# Patient Record
Sex: Female | Born: 1980 | Race: Black or African American | Hispanic: No | Marital: Single | Smoking: Former smoker
Health system: Southern US, Community
[De-identification: ages and names within clinical notes are randomized; demographics above are authoritative.]

## PROBLEM LIST (undated history)

## (undated) DIAGNOSIS — J45909 Unspecified asthma, uncomplicated: Secondary | ICD-10-CM

## (undated) DIAGNOSIS — G4733 Obstructive sleep apnea (adult) (pediatric): Secondary | ICD-10-CM

## (undated) DIAGNOSIS — E669 Obesity, unspecified: Secondary | ICD-10-CM

## (undated) DIAGNOSIS — I272 Pulmonary hypertension, unspecified: Secondary | ICD-10-CM

## (undated) DIAGNOSIS — R030 Elevated blood-pressure reading, without diagnosis of hypertension: Secondary | ICD-10-CM

## (undated) DIAGNOSIS — I10 Essential (primary) hypertension: Secondary | ICD-10-CM

## (undated) DIAGNOSIS — I119 Hypertensive heart disease without heart failure: Secondary | ICD-10-CM

## (undated) HISTORY — DX: Essential (primary) hypertension: I10

## (undated) HISTORY — DX: Pulmonary hypertension, unspecified: I27.20

## (undated) HISTORY — DX: Obstructive sleep apnea (adult) (pediatric): G47.33

## (undated) HISTORY — DX: Elevated blood-pressure reading, without diagnosis of hypertension: R03.0

## (undated) HISTORY — PX: KNEE SURGERY: SHX244

## (undated) HISTORY — DX: Hypertensive heart disease without heart failure: I11.9

## (undated) HISTORY — PX: CYST EXCISION: SHX5701

---

## 1999-04-26 ENCOUNTER — Emergency Department (HOSPITAL_COMMUNITY): Admission: EM | Admit: 1999-04-26 | Discharge: 1999-04-26 | Payer: Self-pay | Admitting: Emergency Medicine

## 1999-04-26 ENCOUNTER — Encounter: Payer: Self-pay | Admitting: Emergency Medicine

## 1999-08-05 ENCOUNTER — Emergency Department (HOSPITAL_COMMUNITY): Admission: EM | Admit: 1999-08-05 | Discharge: 1999-08-06 | Payer: Self-pay

## 2004-05-09 ENCOUNTER — Emergency Department (HOSPITAL_COMMUNITY): Admission: EM | Admit: 2004-05-09 | Discharge: 2004-05-09 | Payer: Self-pay | Admitting: Emergency Medicine

## 2005-03-14 ENCOUNTER — Emergency Department (HOSPITAL_COMMUNITY): Admission: EM | Admit: 2005-03-14 | Discharge: 2005-03-14 | Payer: Self-pay | Admitting: Emergency Medicine

## 2005-09-07 ENCOUNTER — Emergency Department (HOSPITAL_COMMUNITY): Admission: EM | Admit: 2005-09-07 | Discharge: 2005-09-07 | Payer: Self-pay | Admitting: Emergency Medicine

## 2006-09-11 ENCOUNTER — Inpatient Hospital Stay (HOSPITAL_COMMUNITY): Admission: AD | Admit: 2006-09-11 | Discharge: 2006-09-14 | Payer: Self-pay | Admitting: Obstetrics & Gynecology

## 2007-07-17 ENCOUNTER — Emergency Department (HOSPITAL_COMMUNITY): Admission: EM | Admit: 2007-07-17 | Discharge: 2007-07-17 | Payer: Self-pay | Admitting: Emergency Medicine

## 2010-07-25 NOTE — Discharge Summary (Signed)
NAMEELPIDIA, Teresa Crane              ACCOUNT NO.:  [id number]   MEDICAL RECORD NO.:  [id number]          PATIENT TYPE:  INP   LOCATION:  9123                          FACILITY:  [facility]   PHYSICIAN:  Sherron Monday, MD        DATE OF BIRTH:  [birth year]   DATE OF ADMISSION:  09/11/2006  DATE OF DISCHARGE:  09/14/2006                               DISCHARGE SUMMARY   ADMISSION DIAGNOSIS:  Intrauterine pregnancy at term in early labor with  regular contractions.   DISCHARGE DIAGNOSIS:  Intrauterine pregnancy at term in early labor with  regular contractions and spontaneous vaginal delivery.   HISTORY OF PRESENT ILLNESS:  This is a 30 year old G4, P0-0-3-0 at 40+  weeks in early labor with contractions every 10 minutes and then to  every 5 minutes, regular and painful.  No loss of fluid.  No vaginal  bleeding.  Good fetal movement.  Uncomplicated prenatal care.   PAST MEDICAL HISTORY:  Asthma.  No hospitalizations.   PAST SURGICAL HISTORY:  Breast lumpectomy.  Left knee surgery and  tonsillectomy and adenoidectomy.   OBSTETRICAL/GYN HISTORY:  G1, 2 and 3 were all spontaneous abortions.  G4 is the present pregnancy.  Abnormal Pap smear with repeat normal.  No  sexually transmitted diseases.   MEDICATIONS:  None.   ALLERGIES:  No known drug allergies.   SOCIAL HISTORY:  She denies alcohol, tobacco or drug use.  She is  single.   FAMILY HISTORY:  Coronary artery disease in uncle.  Hypertension in  mother.  Maternal aunt and uncle with epilepsy.   PRENATAL LABORATORY DATA:  Hemoglobin 11.7, platelet count 194,000.  Blood type A positive, antibody screen negative.  Sickle cell within  normal limits.  Pap smear within normal limits.  Gonorrhea and Chlamydia  negative.  RPR nonreactive.  Rubella immune.  Hepatitis B surface  antigen negative.  HIV negative.  Quad screen negative.  Positive Group  B Strep.  Glucola 87.  Cystic fibrosis screen was negative.   An ultrasound performed on  March 08, 2006 at 13-6/7 weeks revealed a  normal nuchal thickness.  Ultrasound on April 05, 2006 17-6/7 weeks  revealed anterior placenta, normal anatomy, 213 gram female infant.   PHYSICAL EXAMINATION:  VITAL SIGNS:  On admission the exam was benign.  Patient was afebrile and vital signs were stable.  Fetal heart tones  were 120's and reactive.  PELVIC:  She was contracting every two to three minutes.  Vaginal exam  showed cervix 2 to 3 cm dilated, 90% effaced, minus 1 station.   HOSPITAL COURSE:  The patient was admitted and given Penicillin for  Group B Strep prophylaxis.  Written to get her epidural p.r.n.  We would  augment her labor with Pitocin and rupture after the Penicillin and  expect spontaneous vaginal delivery.  She had clear amniotic fluid.  At  that time she was 4 cm dilated, 100% effaced and minus 1 station.  Her  intrapartum care was relatively uncomplicated. She had some late  decelerations that were relieved with position change, IV bolus and  alternating her  Pitocin.  She progressed to complete/complete +2, sat  for 30 minutes with a reactive tracing then began pushing. She pushed  well with good progress to deliver a viable female infant at 11:54 on September 12, 2006 with Apgar's of 7 at 1 minute and 9 at 5 minutes and weight of 7  pounds, 5 ounces.  Placenta was expressed intact.  She had bilateral  labial lacerations and midline high periclitoral lesion that was  repaired with 3-0 Vicryl.  EBL was approximately 500 cc.  Discussed with  the family and the patient the circumcision for the female infant  including the risks, benefits and alternatives and she wished to  proceed.  This was performed on postpartum day #1.  Her postpartum  course was relatively uncomplicated.  She remained afebrile with stable  vital signs throughout.  Her hemoglobin decreased from 12.1 to 9.9.  She  was discharged home on postpartum day #2 with no complaints, normal  lochia.  Her pain was  well controlled.  She was ambulating well.  She  was given routine discharge instructions including numbers to call if  she has any questions or problems as well as prescriptions for Motrin,  Vicodin and prenatal vitamins.   DISCHARGE INFORMATION:  She plans to bottlefeed, A positive, Rubella  immune.  Will start combination oral contraceptive pills at 6 weeks.  Her hemoglobin decreased from 12.1 to 9.9.      Sherron Monday, MD  Electronically Signed     JB/MEDQ  D:  09/14/2006  T:  09/14/2006  Job:  [id number]

## 2010-12-23 LAB — CBC
HCT: 31 — ABNORMAL LOW
HCT: 37.4
Hemoglobin: 12.1
Hemoglobin: 9.9 — ABNORMAL LOW
MCHC: 31.8
MCHC: 32.2
MCV: 82.5
MCV: 84
Platelets: 180
Platelets: 237
RBC: 3.69 — ABNORMAL LOW
RBC: 4.53
RDW: 14.9 — ABNORMAL HIGH
RDW: 14.9 — ABNORMAL HIGH
WBC: 15 — ABNORMAL HIGH
WBC: 8.3

## 2010-12-23 LAB — RPR: RPR Ser Ql: NONREACTIVE

## 2013-12-17 ENCOUNTER — Encounter (HOSPITAL_COMMUNITY): Payer: Self-pay | Admitting: Emergency Medicine

## 2013-12-17 ENCOUNTER — Emergency Department (HOSPITAL_COMMUNITY)
Admission: EM | Admit: 2013-12-17 | Discharge: 2013-12-17 | Disposition: A | Discharge status: Home / Self Care | Payer: Medicaid Other | Attending: Emergency Medicine | Admitting: Emergency Medicine

## 2013-12-17 DIAGNOSIS — L03113 Cellulitis of right upper limb: Secondary | ICD-10-CM | POA: Insufficient documentation

## 2013-12-17 DIAGNOSIS — Z720 Tobacco use: Secondary | ICD-10-CM | POA: Diagnosis not present

## 2013-12-17 DIAGNOSIS — J45909 Unspecified asthma, uncomplicated: Secondary | ICD-10-CM | POA: Diagnosis not present

## 2013-12-17 DIAGNOSIS — Z880 Allergy status to penicillin: Secondary | ICD-10-CM | POA: Diagnosis not present

## 2013-12-17 DIAGNOSIS — L02411 Cutaneous abscess of right axilla: Secondary | ICD-10-CM | POA: Diagnosis present

## 2013-12-17 HISTORY — DX: Unspecified asthma, uncomplicated: J45.909

## 2013-12-17 MED ORDER — SULFAMETHOXAZOLE-TRIMETHOPRIM 800-160 MG PO TABS: 1.0000 | ORAL_TABLET | Freq: Two times a day (BID) | ORAL | Status: DC

## 2013-12-17 MED ORDER — TRAMADOL HCL 50 MG PO TABS
50.0000 mg | ORAL_TABLET | Freq: Once | ORAL | Status: AC
  Administered 2013-12-17: 50 mg via ORAL
  Filled 2013-12-17: qty 1

## 2013-12-17 MED ORDER — SULFAMETHOXAZOLE-TMP DS 800-160 MG PO TABS
1.0000 | ORAL_TABLET | Freq: Once | ORAL | Status: AC
  Administered 2013-12-17: 1 via ORAL
  Filled 2013-12-17: qty 1

## 2013-12-17 MED ORDER — CEPHALEXIN 500 MG PO CAPS: 500.0000 mg | ORAL_CAPSULE | Freq: Four times a day (QID) | ORAL | Status: DC

## 2013-12-17 MED ORDER — CEPHALEXIN 250 MG PO CAPS
500.0000 mg | ORAL_CAPSULE | Freq: Once | ORAL | Status: AC
  Administered 2013-12-17: 500 mg via ORAL
  Filled 2013-12-17: qty 2

## 2013-12-17 MED ORDER — TRAMADOL HCL 50 MG PO TABS: 50.0000 mg | ORAL_TABLET | Freq: Four times a day (QID) | ORAL | Status: DC | PRN

## 2013-12-17 NOTE — ED Provider Notes (Signed)
Medical screening examination/treatment/procedure(s) were performed by non-physician practitioner and as supervising physician I was immediately available for consultation/collaboration.   EKG Interpretation None        Lonnie Rosado, MD 12/17/13 1806 

## 2013-12-17 NOTE — Discharge Instructions (Signed)

## 2013-12-17 NOTE — ED Notes (Signed)
Declined W/C at D/C and was escorted to lobby by RN. 

## 2013-12-17 NOTE — ED Notes (Signed)
Has a possible abscess to right lateral chest under arm. Noticed it yesterday but was more painful today and red.

## 2013-12-17 NOTE — ED Provider Notes (Signed)
CSN: [id number]     Arrival date & time 12/17/13  1123 History  This chart was scribed for non-physician practitioner Marlon Peliffany Guiselle Mian, PA-C working with Glynn OctaveStephen Rancour, MD by Conchita ParisNadim Abuhashem, ED Scribe. This patient was seen in TR11C/TR11C and the patient's care was started at 11:47 AM.     Chief Complaint  Patient presents with  . Abscess   Patient is a 33 y.o. female presenting with abscess. The history is provided by the patient. No language interpreter was used.  Abscess Associated symptoms: no fever, no nausea and no vomiting    HPI Comments: Norton Pastelamika N Elsayed is a 33 y.o. female who presents to the Emergency Department complaining of an  abscess to the right lateral chest under her arm. She notes its painful and started swelling and hurting yesterday. She has never had and abscess before. Pt said she has taken motrin and advil for the pain but notes no relief to her pain. Denies fever, nausea, vomiting, diarrhea and any weakness.   Past Medical History  Diagnosis Date  . Asthma    Past Surgical History  Procedure Laterality Date  . Knee surgery Left    No family history on file. History  Substance Use Topics  . Smoking status: Current Every Day Smoker  . Smokeless tobacco: Not on file  . Alcohol Use: Yes     Comment: social   OB History   Grav Para Term Preterm Abortions TAB SAB Ect Mult Living                 Review of Systems  Constitutional: Negative for fever.  Gastrointestinal: Negative for nausea, vomiting and diarrhea.  Skin: Positive for color change.  Neurological: Negative for weakness.  All other systems reviewed and are negative.     Allergies  Penicillins  Home Medications   Prior to Admission medications   Medication Sig Start Date End Date Taking? Authorizing Provider  cephALEXin (KEFLEX) 500 MG capsule Take 1 capsule (500 mg total) by mouth 4 (four) times daily. 12/17/13   Emerie Vanderkolk Irine SealG Wilho Sharpley, PA-C  sulfamethoxazole-trimethoprim (SEPTRA DS)  800-160 MG per tablet Take 1 tablet by mouth every 12 (twelve) hours. 12/17/13   Dorthula Matasiffany G Katharina Jehle, PA-C  traMADol (ULTRAM) 50 MG tablet Take 1 tablet (50 mg total) by mouth every 6 (six) hours as needed. 12/17/13   Samba Cumba Irine SealG Jahi Roza, PA-C   BP 136/88  Pulse 82  Temp(Src) 98.5 F (36.9 C) (Oral)  Resp 16  Ht 5\' 5"  (1.651 m)  Wt 250 lb (113.399 kg)  BMI 41.60 kg/m2  SpO2 98%  LMP 11/23/2013 Physical Exam  Nursing note and vitals reviewed. Constitutional: She is oriented to person, place, and time. She appears well-developed and well-nourished. No distress.  HENT:  Head: Normocephalic and atraumatic.  Eyes: EOM are normal.  Neck: Normal range of motion.  Cardiovascular: Normal rate.   Pulmonary/Chest: Effort normal.  Neurological: She is alert and oriented to person, place, and time.  Skin: Skin is warm and dry.  Area of induration and erythema to right lateral bra line. It  is painful to palpation. It is firm and without fluctuance. Approximately 1 x 1 cm  Psychiatric: She has a normal mood and affect. Her behavior is normal.    ED Course  Procedures DIAGNOSTIC STUDIES: Oxygen Saturation is 98% on room air, normal by my interpretation.    COORDINATION OF CARE: 11:53 AM Will give pt Pain medication and abx. and pt agreed to plan.  Labs Review Labs Reviewed - No data to display  Imaging Review No results found.   EKG Interpretation None      MDM   Final diagnoses:  Cellulitis of right upper extremity   Medications  traMADol (ULTRAM) tablet 50 mg (50 mg Oral Given 12/17/13 1159)  cephALEXin (KEFLEX) capsule 500 mg (500 mg Oral Given 12/17/13 1201)  sulfamethoxazole-trimethoprim (BACTRIM DS) 800-160 MG per tablet 1 tablet (1 tablet Oral Given 12/17/13 1201)   Pt just found out that her best friends father passed away before I entered the room and is crying. I am not convinced that I would obtain a significant amount of drainage from I&D. Due to this and her being  visibly emotionally upset, will try trial of antibiotics. She has been made aware that an abscess may still develop despite antibiotic therapy. Rx abx and pain medications. \ 33 y.o.Radene Ouamika N Huxford's evaluation in the Emergency Department is complete. It has been determined that no acute conditions requiring further emergency intervention are present at this time. The patient/guardian have been advised of the diagnosis and plan. We have discussed signs and symptoms that warrant return to the ED, such as changes or worsening in symptoms.  Vital signs are stable at discharge. Filed Vitals:   12/17/13 1129  BP: 136/88  Pulse: 82  Temp: 98.5 F (36.9 C)  Resp: 16    Patient/guardian has voiced understanding and agreed to follow-up with the PCP or specialist.   any,  Dorthula Matasiffany G Avo Schlachter, PA-C 12/17/13 1205

## 2015-04-04 ENCOUNTER — Encounter (HOSPITAL_COMMUNITY): Payer: Self-pay | Admitting: Emergency Medicine

## 2015-04-04 ENCOUNTER — Emergency Department (INDEPENDENT_AMBULATORY_CARE_PROVIDER_SITE_OTHER)
Admission: EM | Admit: 2015-04-04 | Discharge: 2015-04-04 | Disposition: A | Discharge status: Home / Self Care | Payer: Self-pay | Source: Home / Self Care | Attending: Family Medicine | Admitting: Family Medicine

## 2015-04-04 DIAGNOSIS — M791 Myalgia: Secondary | ICD-10-CM

## 2015-04-04 DIAGNOSIS — R293 Abnormal posture: Secondary | ICD-10-CM

## 2015-04-04 DIAGNOSIS — T148 Other injury of unspecified body region: Secondary | ICD-10-CM

## 2015-04-04 DIAGNOSIS — S338XXA Sprain of other parts of lumbar spine and pelvis, initial encounter: Secondary | ICD-10-CM

## 2015-04-04 DIAGNOSIS — E669 Obesity, unspecified: Secondary | ICD-10-CM

## 2015-04-04 DIAGNOSIS — T148XXA Other injury of unspecified body region, initial encounter: Secondary | ICD-10-CM

## 2015-04-04 DIAGNOSIS — M7918 Myalgia, other site: Secondary | ICD-10-CM

## 2015-04-04 DIAGNOSIS — S39012A Strain of muscle, fascia and tendon of lower back, initial encounter: Secondary | ICD-10-CM

## 2015-04-04 DIAGNOSIS — M546 Pain in thoracic spine: Secondary | ICD-10-CM

## 2015-04-04 MED ORDER — DICLOFENAC POTASSIUM 50 MG PO TABS: 50.0000 mg | ORAL_TABLET | Freq: Three times a day (TID) | ORAL | Status: DC

## 2015-04-04 MED ORDER — CYCLOBENZAPRINE HCL 5 MG PO TABS: 5.0000 mg | ORAL_TABLET | Freq: Three times a day (TID) | ORAL | Status: DC | PRN

## 2015-04-04 NOTE — ED Notes (Signed)
Discussed patient's request for a light duty work note.  Teresa Rasmussen, np agreed to a 2 day light duty, specifying a return to full duty on Sunday.  Onalee Hua, NP instructed this nurse no fmla papers would be completed here.  Instructed patient that we do not complete fmla papers.

## 2015-04-04 NOTE — Discharge Instructions (Signed)
Back Pain, Adult Back pain is very common. The pain often gets better over time. The cause of back pain is usually not dangerous. Most people can learn to manage their back pain on their own.  HOME CARE  Watch your back pain for any changes. The following actions may help to lessen any pain you are feeling:  Stay active. Start with short walks on flat ground if you can. Try to walk farther each day.  Exercise regularly as told by your doctor. Exercise helps your back heal faster. It also helps avoid future injury by keeping your muscles strong and flexible.  Do not sit, drive, or stand in one place for more than 30 minutes.  Do not stay in bed. Resting more than 1-2 days can slow down your recovery.  Be careful when you bend or lift an object. Use good form when lifting:  Bend at your knees.  Keep the object close to your body.  Do not twist.  Sleep on a firm mattress. Lie on your side, and bend your knees. If you lie on your back, put a pillow under your knees.  Take medicines only as told by your doctor.  Put ice on the injured area.  Put ice in a plastic bag.  Place a towel between your skin and the bag.  Leave the ice on for 20 minutes, 2-3 times a day for the first 2-3 days. After that, you can switch between ice and heat packs.  Avoid feeling anxious or stressed. Find good ways to deal with stress, such as exercise.  Maintain a healthy weight. Extra weight puts stress on your back. GET HELP IF:   You have pain that does not go away with rest or medicine.  You have worsening pain that goes down into your legs or buttocks.  You have pain that does not get better in one week.  You have pain at night.  You lose weight.  You have a fever or chills. GET HELP RIGHT AWAY IF:   You cannot control when you poop (bowel movement) or pee (urinate).  Your arms or legs feel weak.  Your arms or legs lose feeling (numbness).  You feel sick to your stomach (nauseous) or  throw up (vomit).  You have belly (abdominal) pain.  You feel like you may pass out (faint).   This information is not intended to replace advice given to you by your health care provider. Make sure you discuss any questions you have with your health care provider.   Document Released: 08/12/2007 Document Revised: 03/16/2014 Document Reviewed: 06/27/2013 Elsevier Interactive Patient Education 2016 [place].  Back Exercises If you have pain in your back, do these exercises 2-3 times each day or as told by your doctor. When the pain goes away, do the exercises once each day, but repeat the steps more times for each exercise (do more repetitions). If you do not have pain in your back, do these exercises once each day or as told by your doctor. EXERCISES Single Knee to Chest Do these steps 3-5 times in a row for each leg:  Lie on your back on a firm bed or the floor with your legs stretched out.  Bring one knee to your chest.  Hold your knee to your chest by grabbing your knee or thigh.  Pull on your knee until you feel a gentle stretch in your lower back.  Keep doing the stretch for 10-30 seconds.  Slowly let go of your  leg and straighten it. Pelvic Tilt Do these steps 5-10 times in a row:  Lie on your back on a firm bed or the floor with your legs stretched out.  Bend your knees so they point up to the ceiling. Your feet should be flat on the floor.  Tighten your lower belly (abdomen) muscles to press your lower back against the floor. This will make your tailbone point up to the ceiling instead of pointing down to your feet or the floor.  Stay in this position for 5-10 seconds while you gently tighten your muscles and breathe evenly. Cat-Cow Do these steps until your lower back bends more easily:  Get on your hands and knees on a firm surface. Keep your hands under your shoulders, and keep your knees under your hips. You may put padding under your knees.  Let your head  hang down, and make your tailbone point down to the floor so your lower back is round like the back of a cat.  Stay in this position for 5 seconds.  Slowly lift your head and make your tailbone point up to the ceiling so your back hangs low (sags) like the back of a cow.  Stay in this position for 5 seconds. Press-Ups Do these steps 5-10 times in a row:  Lie on your belly (face-down) on the floor.  Place your hands near your head, about shoulder-width apart.  While you keep your back relaxed and keep your hips on the floor, slowly straighten your arms to raise the top half of your body and lift your shoulders. Do not use your back muscles. To make yourself more comfortable, you may change where you place your hands.  Stay in this position for 5 seconds.  Slowly return to lying flat on the floor. Bridges Do these steps 10 times in a row:  Lie on your back on a firm surface.  Bend your knees so they point up to the ceiling. Your feet should be flat on the floor.  Tighten your butt muscles and lift your butt off of the floor until your waist is almost as high as your knees. If you do not feel the muscles working in your butt and the back of your thighs, slide your feet 1-2 inches farther away from your butt.  Stay in this position for 3-5 seconds.  Slowly lower your butt to the floor, and let your butt muscles relax. If this exercise is too easy, try doing it with your arms crossed over your chest. Belly Crunches Do these steps 5-10 times in a row:  Lie on your back on a firm bed or the floor with your legs stretched out.  Bend your knees so they point up to the ceiling. Your feet should be flat on the floor.  Cross your arms over your chest.  Tip your chin a little bit toward your chest but do not bend your neck.  Tighten your belly muscles and slowly raise your chest just enough to lift your shoulder blades a tiny bit off of the floor.  Slowly lower your chest and your  head to the floor. Back Lifts Do these steps 5-10 times in a row:  Lie on your belly (face-down) with your arms at your sides, and rest your forehead on the floor.  Tighten the muscles in your legs and your butt.  Slowly lift your chest off of the floor while you keep your hips on the floor. Keep the back of your head  in line with the curve in your back. Look at the floor while you do this.  Stay in this position for 3-5 seconds.  Slowly lower your chest and your face to the floor. GET HELP IF:  Your back pain gets a lot worse when you do an exercise.  Your back pain does not lessen 2 hours after you exercise. If you have any of these problems, stop doing the exercises. Do not do them again unless your doctor says it is okay. GET HELP RIGHT AWAY IF:  You have sudden, very bad back pain. If this happens, stop doing the exercises. Do not do them again unless your doctor says it is okay.   This information is not intended to replace advice given to you by your health care provider. Make sure you discuss any questions you have with your health care provider.   Document Released: 03/28/2010 Document Revised: 11/14/2014 Document Reviewed: 04/19/2014 Elsevier Interactive Patient Education 2016 [place] Injury Prevention Back injuries can be very painful. They can also be difficult to heal. After having one back injury, you are more likely to injure your back again. It is important to learn how to avoid injuring or re-injuring your back. The following tips can help you to prevent a back injury. WHAT SHOULD I KNOW ABOUT PHYSICAL FITNESS?  Exercise for 30 minutes per day on most days of the week or as told by your doctor. Make sure to:  Do aerobic exercises, such as walking, jogging, biking, or swimming.  Do exercises that increase balance and strength, such as tai chi and yoga.  Do stretching exercises. This helps with flexibility.  Try to develop strong belly (abdominal)  muscles. Your belly muscles help to support your back.  Stay at a healthy weight. This helps to decrease your risk of a back injury. WHAT SHOULD I KNOW ABOUT MY DIET?  Talk with your doctor about your overall diet. Take supplements and vitamins only as told by your doctor.  Talk with your doctor about how much calcium and vitamin D you need each day. These nutrients help to prevent weakening of the bones (osteoporosis).  Include good sources of calcium in your diet, such as:  Dairy products.  Green leafy vegetables.  Products that have had calcium added to them (fortified).  Include good sources of vitamin D in your diet, such as:  Milk.  Foods that have had vitamin D added to them. WHAT SHOULD I KNOW ABOUT MY POSTURE?  Sit up straight and stand up straight. Avoid leaning forward when you sit or hunching over when you stand.  Choose chairs that have good low-back (lumbar) support.  If you work at a desk, sit close to it so you do not need to lean over. Keep your chin tucked in. Keep your neck drawn back. Keep your elbows bent so your arms look like the letter "L" (right angle).  Sit high and close to the steering wheel when you drive. Add a low-back support to your car seat, if needed.  Avoid sitting or standing in one position for very long. Take breaks to get up, stretch, and walk around at least one time every hour. Take breaks every hour if you are driving for long periods of time.  Sleep on your side with your knees slightly bent, or sleep on your back with a pillow under your knees. Do not lie on the front of your body to sleep. WHAT SHOULD I KNOW ABOUT LIFTING, TWISTING,  AND REACHING Lifting and Heavy Lifting  Avoid heavy lifting, especially lifting over and over again. If you must do heavy lifting:  Stretch before lifting.  Work slowly.  Rest between lifts.  Use a tool such as a cart or a dolly to move objects if one is available.  Make several small trips  instead of carrying one heavy load.  Ask for help when you need it, especially when moving big objects.  Follow these steps when lifting:  Stand with your feet shoulder-width apart.  Get as close to the object as you can. Do not pick up a heavy object that is far from your body.  Use handles or lifting straps if they are available.  Bend at your knees. Squat down, but keep your heels off the floor.  Keep your shoulders back. Keep your chin tucked in. Keep your back straight.  Lift the object slowly while you tighten the muscles in your legs, belly, and butt. Keep the object as close to the center of your body as possible.  Follow these steps when putting down a heavy load:  Stand with your feet shoulder-width apart.  Lower the object slowly while you tighten the muscles in your legs, belly, and butt. Keep the object as close to the center of your body as possible.  Keep your shoulders back. Keep your chin tucked in. Keep your back straight.  Bend at your knees. Squat down, but keep your heels off the floor.  Use handles or lifting straps if they are available. Twisting and Reaching  Avoid lifting heavy objects above your waist.  Do not twist at your waist while you are lifting or carrying a load. If you need to turn, move your feet.  Do not bend over without bending at your knees.  Avoid reaching over your head, across a table, or for an object on a high surface.  WHAT ARE SOME OTHER TIPS?  Avoid wet floors and icy ground. Keep sidewalks clear of ice to prevent falls.   Do not sleep on a mattress that is too soft or too hard.   Keep items that you use often within easy reach.   Put heavier objects on shelves at waist level, and put lighter objects on lower or higher shelves.  Find ways to lower your stress, such as:  Exercise.  Massage.  Relaxation techniques.  Talk with your doctor if you feel anxious or depressed. These conditions can make back pain  worse.  Wear flat heel shoes with cushioned soles.  Avoid making quick (sudden) movements.  Use both shoulder straps when carrying a backpack.  Do not use any tobacco products, including cigarettes, chewing tobacco, or electronic cigarettes. If you need help quitting, ask your doctor.   This information is not intended to replace advice given to you by your health care provider. Make sure you discuss any questions you have with your health care provider.   Document Released: 08/12/2007 Document Revised: 07/10/2014 Document Reviewed: 02/27/2014 Elsevier Interactive Patient Education 2016 [employer]  Chronic Back Pain  When back pain lasts longer than 3 months, it is called chronic back pain.People with chronic back pain often go through certain periods that are more intense (flare-ups).  CAUSES Chronic back pain can be caused by wear and tear (degeneration) on different structures in your back. These structures include:  The bones of your spine (vertebrae) and the joints surrounding your spinal cord and nerve roots (facets).  The strong, fibrous tissues that connect your  vertebrae (ligaments). Degeneration of these structures may result in pressure on your nerves. This can lead to constant pain. HOME CARE INSTRUCTIONS  Avoid bending, heavy lifting, prolonged sitting, and activities which make the problem worse.  Take brief periods of rest throughout the day to reduce your pain. Lying down or standing usually is better than sitting while you are resting.  Take over-the-counter or prescription medicines only as directed by your caregiver. SEEK IMMEDIATE MEDICAL CARE IF:   You have weakness or numbness in one of your legs or feet.  You have trouble controlling your bladder or bowels.  You have nausea, vomiting, abdominal pain, shortness of breath, or fainting.   This information is not intended to replace advice given to you by your health care provider. Make sure you discuss  any questions you have with your health care provider.   Document Released: 04/02/2004 Document Revised: 05/18/2011 Document Reviewed: 08/13/2014 Elsevier Interactive Patient Education 2016 [employer] Inc[employer]  BMI for Adults Body mass index (BMI) is a number that is calculated from a person's weight and height. In most adults, the number is used to find how much of an adult's weight is made up of fat. BMI is not as accurate as a direct measure of body fat. HOW IS BMI CALCULATED? BMI is calculated by dividing weight in kilograms by height in meters squared. It can also be calculated by dividing weight in pounds by height in inches squared, then multiplying the resulting number by 703. Charts are available to help you find your BMI quickly and easily without doing this calculation.  HOW IS BMI INTERPRETED? Health care professionals use BMI charts to identify whether an adult is underweight, at a normal weight, or overweight based on the following guidelines:  Underweight: BMI less than 18.5.  Normal weight: BMI between 18.5 and 24.9.  Overweight: BMI between 25 and 29.9.  Obese: BMI of 30 and above. BMI is usually interpreted the same for males and females. Weight includes both fat and muscle, so someone with a muscular build, such as an athlete, may have a BMI that is higher than 24.9. In cases like these, BMI may not accurately depict body fat. To determine if excess body fat is the cause of a BMI of 25 or higher, further assessments may need to be done by a health care provider. WHY IS BMI A USEFUL TOOL? BMI is used to identify a possible weight problem that may be related to a medical problem or may increase the risk for medical problems. BMI can also be used to promote changes to reach a healthy weight.   This information is not intended to replace advice given to you by your health care provider. Make sure you discuss any questions you have with your health care provider.   Document  Released: 11/05/2003 Document Revised: 03/16/2014 Document Reviewed: 07/21/2013 Elsevier Interactive Patient Education 2016 [place] therapy can help ease sore, stiff, injured, and tight muscles and joints. Heat relaxes your muscles, which may help ease your pain.  RISKS AND COMPLICATIONS If you have any of the following conditions, do not use heat therapy unless your health care provider has approved:  Poor circulation.  Healing wounds or scarred skin in the area being treated.  Diabetes, heart disease, or high blood pressure.  Not being able to feel (numbness) the area being treated.  Unusual swelling of the area being treated.  Active infections.  Blood clots.  Cancer.  Inability to communicate pain.  This may include young children and people who have problems with their brain function (dementia).  Pregnancy. Heat therapy should only be used on old, pre-existing, or long-lasting (chronic) injuries. Do not use heat therapy on new injuries unless directed by your health care provider. HOW TO USE HEAT THERAPY There are several different kinds of heat therapy, including:  Moist heat pack.  Warm water bath.  Hot water bottle.  Electric heating pad.  Heated gel pack.  Heated wrap.  Electric heating pad. Use the heat therapy method suggested by your health care provider. Follow your health care provider's instructions on when and how to use heat therapy. GENERAL HEAT THERAPY RECOMMENDATIONS  Do not sleep while using heat therapy. Only use heat therapy while you are awake.  Your skin may turn pink while using heat therapy. Do not use heat therapy if your skin turns red.  Do not use heat therapy if you have new pain.  High heat or long exposure to heat can cause burns. Be careful when using heat therapy to avoid burning your skin.  Do not use heat therapy on areas of your skin that are already irritated, such as with a rash or sunburn. SEEK  MEDICAL CARE IF:  You have blisters, redness, swelling, or numbness.  You have new pain.  Your pain is worse. MAKE SURE YOU:  Understand these instructions.  Will watch your condition.  Will get help right away if you are not doing well or get worse.   This information is not intended to replace advice given to you by your health care provider. Make sure you discuss any questions you have with your health care provider.   Document Released: 05/18/2011 Document Revised: 03/16/2014 Document Reviewed: 04/18/2013 Elsevier Interactive Patient Education 2016 [place] [facility] A sprain is an injury in which a ligament is torn. The ligaments of the lower back are vulnerable to sprains. However, they are strong and require great force to be injured. These ligaments are important for stabilizing the spinal column. Sprains are classified into three categories. Grade 1 sprains cause pain, but the tendon is not lengthened. Grade 2 sprains include a lengthened ligament, due to the ligament being stretched or partially ruptured. With grade 2 sprains there is still function, although the function may be decreased. Grade 3 sprains involve a complete tear of the tendon or muscle, and function is usually impaired. SYMPTOMS   Severe pain in the lower back.  Sometimes, a feeling of a "pop," "snap," or tear, at the time of injury.  Tenderness and sometimes swelling at the injury site.  Uncommonly, bruising (contusion) within 48 hours of injury.  Muscle spasms in the back. CAUSES  Low back sprains occur when a force is placed on the ligaments that is greater than they can handle. Common causes of injury include:  Performing a stressful act while off-balance.  Repetitive stressful activities that involve movement of the lower back.  Direct hit (trauma) to the lower back. RISK INCREASES WITH:  Contact sports (football, wrestling).  Collisions (major skiing  accidents).  Sports that require throwing or lifting (baseball, weightlifting).  Sports involving twisting of the spine (gymnastics, diving, tennis, golf).  Poor strength and flexibility.  Inadequate protection.  Previous back injury or surgery (especially fusion). PREVENTION  Wear properly fitted and padded protective equipment.  Warm up and stretch properly before activity.  Allow for adequate recovery between workouts.  Maintain physical fitness:  Strength, flexibility, and endurance.  Cardiovascular fitness.  Maintain a healthy body weight. PROGNOSIS  If treated properly, low back sprains usually heal with non-surgical treatment. The length of time for healing depends on the severity of the injury.  RELATED COMPLICATIONS   Recurring symptoms, resulting in a chronic problem.  Chronic inflammation and pain in the low back.  Delayed healing or resolution of symptoms, especially if activity is resumed too soon.  Prolonged impairment.  Unstable or arthritic joints of the low back. TREATMENT  Treatment first involves the use of ice and medicine, to reduce pain and inflammation. The use of strengthening and stretching exercises may help reduce pain with activity. These exercises may be performed at home or with a therapist. Severe injuries may require referral to a therapist for further evaluation and treatment, such as ultrasound. Your caregiver may advise that you wear a back brace or corset, to help reduce pain and discomfort. Often, prolonged bed rest results in greater harm then benefit. Corticosteroid injections may be recommended. However, these should be reserved for the most serious cases. It is important to avoid using your back when lifting objects. At night, sleep on your back on a firm mattress, with a pillow placed under your knees. If non-surgical treatment is unsuccessful, surgery may be needed.  MEDICATION   If pain medicine is needed, nonsteroidal  anti-inflammatory medicines (aspirin and ibuprofen), or other minor pain relievers (acetaminophen), are often advised.  Do not take pain medicine for 7 days before surgery.  Prescription pain relievers may be given, if your caregiver thinks they are needed. Use only as directed and only as much as you need.  Ointments applied to the skin may be helpful.  Corticosteroid injections may be given by your caregiver. These injections should be reserved for the most serious cases, because they may only be given a certain number of times. HEAT AND COLD  Cold treatment (icing) should be applied for 10 to 15 minutes every 2 to 3 hours for inflammation and pain, and immediately after activity that aggravates your symptoms. Use ice packs or an ice massage.  Heat treatment may be used before performing stretching and strengthening activities prescribed by your caregiver, physical therapist, or athletic trainer. Use a heat pack or a warm water soak. SEEK MEDICAL CARE IF:   Symptoms get worse or do not improve in 2 to 4 weeks, despite treatment.  You develop numbness or weakness in either leg.  You lose bowel or bladder function.  Any of the following occur after surgery: fever, increased pain, swelling, redness, drainage of fluids, or bleeding in the affected area.  New, unexplained symptoms develop. (Drugs used in treatment may produce side effects.) EXERCISES  RANGE OF MOTION (ROM) AND STRETCHING EXERCISES - Low Back Sprain Most people with lower back pain will find that their symptoms get worse with excessive bending forward (flexion) or arching at the lower back (extension). The exercises that will help resolve your symptoms will focus on the opposite motion.  Your physician, physical therapist or athletic trainer will help you determine which exercises will be most helpful to resolve your lower back pain. Do not complete any exercises without first consulting with your caregiver. Discontinue any  exercises which make your symptoms worse, until you speak to your caregiver. If you have pain, numbness or tingling which travels down into your buttocks, leg or foot, the goal of the therapy is for these symptoms to move closer to your back and eventually resolve. Sometimes, these leg symptoms will get better,  but your lower back pain may worsen. This is often an indication of progress in your rehabilitation. Be very alert to any changes in your symptoms and the activities in which you participated in the 24 hours prior to the change. Sharing this information with your caregiver will allow him or her to most efficiently treat your condition. These exercises may help you when beginning to rehabilitate your injury. Your symptoms may resolve with or without further involvement from your physician, physical therapist or athletic trainer. While completing these exercises, remember:   Restoring tissue flexibility helps normal motion to return to the joints. This allows healthier, less painful movement and activity.  An effective stretch should be held for at least 30 seconds.  A stretch should never be painful. You should only feel a gentle lengthening or release in the stretched tissue. FLEXION RANGE OF MOTION AND STRETCHING EXERCISES: STRETCH - Flexion, Single Knee to Chest   Lie on a firm bed or floor with both legs extended in front of you.  Keeping one leg in contact with the floor, bring your opposite knee to your chest. Hold your leg in place by either grabbing behind your thigh or at your knee.  Pull until you feel a gentle stretch in your low back. Hold __________ seconds.  Slowly release your grasp and repeat the exercise with the opposite side. Repeat __________ times. Complete this exercise __________ times per day.  STRETCH - Flexion, Double Knee to Chest  Lie on a firm bed or floor with both legs extended in front of you.  Keeping one leg in contact with the floor, bring your  opposite knee to your chest.  Tense your stomach muscles to support your back and then lift your other knee to your chest. Hold your legs in place by either grabbing behind your thighs or at your knees.  Pull both knees toward your chest until you feel a gentle stretch in your low back. Hold __________ seconds.  Tense your stomach muscles and slowly return one leg at a time to the floor. Repeat __________ times. Complete this exercise __________ times per day.  STRETCH - Low Trunk Rotation  Lie on a firm bed or floor. Keeping your legs in front of you, bend your knees so they are both pointed toward the ceiling and your feet are flat on the floor.  Extend your arms out to the side. This will stabilize your upper body by keeping your shoulders in contact with the floor.  Gently and slowly drop both knees together to one side until you feel a gentle stretch in your low back. Hold for __________ seconds.  Tense your stomach muscles to support your lower back as you bring your knees back to the starting position. Repeat the exercise to the other side. Repeat __________ times. Complete this exercise __________ times per day  EXTENSION RANGE OF MOTION AND FLEXIBILITY EXERCISES: STRETCH - Extension, Prone on Elbows   Lie on your stomach on the floor, a bed will be too soft. Place your palms about shoulder width apart and at the height of your head.  Place your elbows under your shoulders. If this is too painful, stack pillows under your chest.  Allow your body to relax so that your hips drop lower and make contact more completely with the floor.  Hold this position for __________ seconds.  Slowly return to lying flat on the floor. Repeat __________ times. Complete this exercise __________ times per day.  RANGE OF MOTION -  Extension, Prone Press Ups  Lie on your stomach on the floor, a bed will be too soft. Place your palms about shoulder width apart and at the height of your  head.  Keeping your back as relaxed as possible, slowly straighten your elbows while keeping your hips on the floor. You may adjust the placement of your hands to maximize your comfort. As you gain motion, your hands will come more underneath your shoulders.  Hold this position __________ seconds.  Slowly return to lying flat on the floor. Repeat __________ times. Complete this exercise __________ times per day.  RANGE OF MOTION- Quadruped, Neutral Spine   Assume a hands and knees position on a firm surface. Keep your hands under your shoulders and your knees under your hips. You may place padding under your knees for comfort.  Drop your head and point your tailbone toward the ground below you. This will round out your lower back like an angry cat. Hold this position for __________ seconds.  Slowly lift your head and release your tail bone so that your back sags into a large arch, like an old horse.  Hold this position for __________ seconds.  Repeat this until you feel limber in your low back.  Now, find your "sweet spot." This will be the most comfortable position somewhere between the two previous positions. This is your neutral spine. Once you have found this position, tense your stomach muscles to support your low back.  Hold this position for __________ seconds. Repeat __________ times. Complete this exercise __________ times per day.  STRENGTHENING EXERCISES - Low Back Sprain These exercises may help you when beginning to rehabilitate your injury. These exercises should be done near your "sweet spot." This is the neutral, low-back arch, somewhere between fully rounded and fully arched, that is your least painful position. When performed in this safe range of motion, these exercises can be used for people who have either a flexion or extension based injury. These exercises may resolve your symptoms with or without further involvement from your physician, physical therapist or athletic  trainer. While completing these exercises, remember:   Muscles can gain both the endurance and the strength needed for everyday activities through controlled exercises.  Complete these exercises as instructed by your physician, physical therapist or athletic trainer. Increase the resistance and repetitions only as guided.  You may experience muscle soreness or fatigue, but the pain or discomfort you are trying to eliminate should never worsen during these exercises. If this pain does worsen, stop and make certain you are following the directions exactly. If the pain is still present after adjustments, discontinue the exercise until you can discuss the trouble with your caregiver. STRENGTHENING - Deep Abdominals, Pelvic Tilt   Lie on a firm bed or floor. Keeping your legs in front of you, bend your knees so they are both pointed toward the ceiling and your feet are flat on the floor.  Tense your lower abdominal muscles to press your low back into the floor. This motion will rotate your pelvis so that your tail bone is scooping upwards rather than pointing at your feet or into the floor. With a gentle tension and even breathing, hold this position for __________ seconds. Repeat __________ times. Complete this exercise __________ times per day.  STRENGTHENING - Abdominals, Crunches   Lie on a firm bed or floor. Keeping your legs in front of you, bend your knees so they are both pointed toward the ceiling and your feet are  flat on the floor. Cross your arms over your chest.  Slightly tip your chin down without bending your neck.  Tense your abdominals and slowly lift your trunk high enough to just clear your shoulder blades. Lifting higher can put excessive stress on the lower back and does not further strengthen your abdominal muscles.  Control your return to the starting position. Repeat __________ times. Complete this exercise __________ times per day.  STRENGTHENING - Quadruped, Opposite  UE/LE Lift   Assume a hands and knees position on a firm surface. Keep your hands under your shoulders and your knees under your hips. You may place padding under your knees for comfort.  Find your neutral spine and gently tense your abdominal muscles so that you can maintain this position. Your shoulders and hips should form a rectangle that is parallel with the floor and is not twisted.  Keeping your trunk steady, lift your right hand no higher than your shoulder and then your left leg no higher than your hip. Make sure you are not holding your breath. Hold this position for __________ seconds.  Continuing to keep your abdominal muscles tense and your back steady, slowly return to your starting position. Repeat with the opposite arm and leg. Repeat __________ times. Complete this exercise __________ times per day.  STRENGTHENING - Abdominals and Quadriceps, Straight Leg Raise   Lie on a firm bed or floor with both legs extended in front of you.  Keeping one leg in contact with the floor, bend the other knee so that your foot can rest flat on the floor.  Find your neutral spine, and tense your abdominal muscles to maintain your spinal position throughout the exercise.  Slowly lift your straight leg off the floor about 6 inches for a count of 15, making sure to not hold your breath.  Still keeping your neutral spine, slowly lower your leg all the way to the floor. Repeat this exercise with each leg __________ times. Complete this exercise __________ times per day. POSTURE AND BODY MECHANICS CONSIDERATIONS - Low Back Sprain Keeping correct posture when sitting, standing or completing your activities will reduce the stress put on different body tissues, allowing injured tissues a chance to heal and limiting painful experiences. The following are general guidelines for improved posture. Your physician or physical therapist will provide you with any instructions specific to your needs. While  reading these guidelines, remember:  The exercises prescribed by your provider will help you have the flexibility and strength to maintain correct postures.  The correct posture provides the best environment for your joints to work. All of your joints have less wear and tear when properly supported by a spine with good posture. This means you will experience a healthier, less painful body.  Correct posture must be practiced with all of your activities, especially prolonged sitting and standing. Correct posture is as important when doing repetitive low-stress activities (typing) as it is when doing a single heavy-load activity (lifting). RESTING POSITIONS Consider which positions are most painful for you when choosing a resting position. If you have pain with flexion-based activities (sitting, bending, stooping, squatting), choose a position that allows you to rest in a less flexed posture. You would want to avoid curling into a fetal position on your side. If your pain worsens with extension-based activities (prolonged standing, working overhead), avoid resting in an extended position such as sleeping on your stomach. Most people will find more comfort when they rest with their spine in a more  neutral position, neither too rounded nor too arched. Lying on a non-sagging bed on your side with a pillow between your knees, or on your back with a pillow under your knees will often provide some relief. Keep in mind, being in any one position for a prolonged period of time, no matter how correct your posture, can still lead to stiffness. PROPER SITTING POSTURE In order to minimize stress and discomfort on your spine, you must sit with correct posture. Sitting with good posture should be effortless for a healthy body. Returning to good posture is a gradual process. Many people can work toward this most comfortably by using various supports until they have the flexibility and strength to maintain this posture on  their own. When sitting with proper posture, your ears will fall over your shoulders and your shoulders will fall over your hips. You should use the back of the chair to support your upper back. Your lower back will be in a neutral position, just slightly arched. You may place a small pillow or folded towel at the base of your lower back for  support.  When working at a desk, create an environment that supports good, upright posture. Without extra support, muscles tire, which leads to excessive strain on joints and other tissues. Keep these recommendations in mind: CHAIR:  A chair should be able to slide under your desk when your back makes contact with the back of the chair. This allows you to work closely.  The chair's height should allow your eyes to be level with the upper part of your monitor and your hands to be slightly lower than your elbows. BODY POSITION  Your feet should make contact with the floor. If this is not possible, use a foot rest.  Keep your ears over your shoulders. This will reduce stress on your neck and low back. INCORRECT SITTING POSTURES  If you are feeling tired and unable to assume a healthy sitting posture, do not slouch or slump. This puts excessive strain on your back tissues, causing more damage and pain. Healthier options include:  Using more support, like a lumbar pillow.  Switching tasks to something that requires you to be upright or walking.  Talking a brief walk.  Lying down to rest in a neutral-spine position. PROLONGED STANDING WHILE SLIGHTLY LEANING FORWARD  When completing a task that requires you to lean forward while standing in one place for a long time, place either foot up on a stationary 2-4 inch high object to help maintain the best posture. When both feet are on the ground, the lower back tends to lose its slight inward curve. If this curve flattens (or becomes too large), then the back and your other joints will experience too much stress,  tire more quickly, and can cause pain. CORRECT STANDING POSTURES Proper standing posture should be assumed with all daily activities, even if they only take a few moments, like when brushing your teeth. As in sitting, your ears should fall over your shoulders and your shoulders should fall over your hips. You should keep a slight tension in your abdominal muscles to brace your spine. Your tailbone should point down to the ground, not behind your body, resulting in an over-extended swayback posture.  INCORRECT STANDING POSTURES  Common incorrect standing postures include a forward head, locked knees and/or an excessive swayback. WALKING Walk with an upright posture. Your ears, shoulders and hips should all line-up. PROLONGED ACTIVITY IN A FLEXED POSITION When completing a task that  requires you to bend forward at your waist or lean over a low surface, try to find a way to stabilize 3 out of 4 of your limbs. You can place a hand or elbow on your thigh or rest a knee on the surface you are reaching across. This will provide you more stability, so that your muscles do not tire as quickly. By keeping your knees relaxed, or slightly bent, you will also reduce stress across your lower back. CORRECT LIFTING TECHNIQUES DO :  Assume a wide stance. This will provide you more stability and the opportunity to get as close as possible to the object which you are lifting.  Tense your abdominals to brace your spine. Bend at the knees and hips. Keeping your back locked in a neutral-spine position, lift using your leg muscles. Lift with your legs, keeping your back straight.  Test the weight of unknown objects before attempting to lift them.  Try to keep your elbows locked down at your sides in order get the best strength from your shoulders when carrying an object.  Always ask for help when lifting heavy or awkward objects. INCORRECT LIFTING TECHNIQUES DO NOT:   Lock your knees when lifting, even if it is a  small object.  Bend and twist. Pivot at your feet or move your feet when needing to change directions.  Assume that you can safely pick up even a paperclip without proper posture.   This information is not intended to replace advice given to you by your health care provider. Make sure you discuss any questions you have with your health care provider.   Document Released: 02/23/2005 Document Revised: 03/16/2014 Document Reviewed: 06/07/2008 Elsevier Interactive Patient Education 2016 [place].  Obesity Obesity is having too much body fat and a body mass index (BMI) of 30 or more. BMI is a number that is based on your height and weight. BMI is usually figured out by your doctor during regular wellness visits. The number is an estimate of how much body fat you have. Obesity can happen if you eat more calories than you can burn with exercise or other activity. It can cause major health problems or emergencies.  HOME CARE  Exercise and be active as told by your doctor. Try:  Using stairs when you can.  Parking farther away from store doors.  Gardening, biking, or walking.  Eat healthy foods and drinks that are low in calories. Eat more fruits and vegetables.  Limit fast food, sweets, and snack foods that are made with ingredients that are not natural (processed food).  Eat smaller amounts of food.  Keep a journal and write down what you eat every day. Websites can help with this.  Avoid drinking alcohol. Drink more water and drinks that have no calories.  Take vitamins and dietary pills (supplements) only as told by your doctor.  Try going to weight-loss support groups or classes. These can help to lessen stress. Dietitians and counselors may also help. GET HELP RIGHT AWAY IF:  You have pain or tightness in your chest.  You have trouble breathing or feel short of breath.  You feel weak or have loss of feeling (numbness) in your legs.  You feel confused or have trouble  talking.  You have sudden changes in your vision.   This information is not intended to replace advice given to you by your health care provider. Make sure you discuss any questions you have with your health care provider.   Document  Released: 05/18/2011 Document Revised: 03/16/2014 Document Reviewed: 05/18/2011 Elsevier Interactive Patient Education 2016 [place].  Thoracic Strain Thoracic strain is an injury to the muscles or tendons that attach to the upper back. A strain can be mild or severe. A mild strain may take only 1-2 weeks to heal. A severe strain involves torn muscles or tendons, so it may take 6-8 weeks to heal. [place] as needed. Limit your activity as told by your doctor.  If directed, put ice on the injured area:  Put ice in a plastic bag.  Place a towel between your skin and the bag.  Leave the ice on for 20 minutes, 2-3 times per day.  Take over-the-counter and prescription medicines only as told by your doctor.  Begin doing exercises as told by your doctor or physical therapist.  Warm up before being active.  Bend your knees before you lift heavy objects.  Keep all follow-up visits as told by your doctor. This is important. GET HELP IF:  Your pain is not helped by medicine.  Your pain, bruising, or swelling is getting worse.  You have a fever. GET HELP RIGHT AWAY IF:  You have shortness of breath.  You have chest pain.  You have weakness or loss of feeling (numbness) in your legs.  You cannot control when you pee (urinate).   This information is not intended to replace advice given to you by your health care provider. Make sure you discuss any questions you have with your health care provider.   Document Released: 08/12/2007 Document Revised: 11/14/2014 Document Reviewed: 04/19/2014 Elsevier Interactive Patient Education [employer].

## 2015-04-04 NOTE — ED Provider Notes (Signed)
CSN: [id number]     Arrival date & time 04/04/15  1851 History   First MD Initiated Contact with Patient 04/04/15 2017     Chief Complaint  Patient presents with  . Back Pain   (Consider location/radiation/quality/duration/timing/severity/associated sxs/prior Treatment) HPI Comments: 35 year old obese female with large pendulous breasts to obtained a job recently that requires standing and bending over prolonged periods of time is now complaining of pain up and down the length of the back. There is tenderness along the spine and paraspinal musculature and across the lumbar musculature. She states there is a tingling sensation down the mid back. This occurs intermittently for the past 4 months. It is worse when working at her new job which as above, requires prolonged standing and leaning forward.  She is also complaining of intermittent numbness in the feet when sitting for prolonged periods of time.   Past Medical History  Diagnosis Date  . Asthma    Past Surgical History  Procedure Laterality Date  . Knee surgery Left   . Cyst excision     No family history on file. Social History  Substance Use Topics  . Smoking status: Current Every Day Smoker  . Smokeless tobacco: None  . Alcohol Use: Yes     Comment: social   OB History    No data available     Review of Systems  Constitutional: Positive for activity change. Negative for fever and fatigue.  HENT: Negative.   Respiratory: Negative.   Cardiovascular: Negative.   Gastrointestinal: Negative.   Genitourinary: Negative.   Musculoskeletal: Positive for myalgias and back pain. Negative for arthralgias and neck pain.  Skin: Negative.   Neurological: Positive for numbness. Negative for dizziness, tremors, syncope, facial asymmetry and speech difficulty.  Psychiatric/Behavioral: Negative.     Allergies  Penicillins  Home Medications   Prior to Admission medications   Medication Sig Start Date End Date Taking?  Authorizing Provider  cyclobenzaprine (FLEXERIL) 5 MG tablet Take 1 tablet (5 mg total) by mouth 3 (three) times daily as needed for muscle spasms. 04/04/15   Hayden Rasmussen, NP  diclofenac (CATAFLAM) 50 MG tablet Take 1 tablet (50 mg total) by mouth 3 (three) times daily. One tablet TID with food prn pain. 04/04/15   Hayden Rasmussen, NP   Meds Ordered and Administered this Visit  Medications - No data to display  BP 165/109 mmHg  Pulse 74  Temp(Src) 98 F (36.7 C) (Oral)  Resp 20  SpO2 100%  LMP 03/16/2015 No data found.   Physical Exam  Constitutional: She is oriented to person, place, and time. She appears well-developed and well-nourished. No distress.  HENT:  Head: Normocephalic and atraumatic.  Eyes: EOM are normal. Pupils are equal, round, and reactive to light.  Neck: Normal range of motion. Neck supple.  Pulmonary/Chest: Effort normal. No respiratory distress.  Musculoskeletal: She exhibits tenderness. She exhibits no edema.  Tenderness to the parathoracic and paralumbar musculature as well as along the middle of the back. Patient jumps and withdrawals to palpation of the paraspinal musculature. She demonstrates no weakness. Strength is normal. Patient is obese with large pendulous breast which contributes greatly to her back pain in addition to a job in which she has to stand for prolonged appears that time as well as leaning forward.  Lymphadenopathy:    She has no cervical adenopathy.  Neurological: She is alert and oriented to person, place, and time. No cranial nerve deficit.  Skin: Skin is warm and dry.  Nursing note and vitals reviewed.   ED Course  Procedures (including critical care time)  Labs Review Labs Reviewed - No data to display  Imaging Review No results found.   Visual Acuity Review  Right Eye Distance:   Left Eye Distance:   Bilateral Distance:    Right Eye Near:   Left Eye Near:    Bilateral Near:         MDM   1. Pain of paraspinal muscle    2. Bilateral thoracic back pain   3. Lumbosacral strain, initial encounter   4. Obesity   5. Poor posture   6. Muscle strain    Patient has back pain due to muscle strain and stress due to poor posture body habitus and her job. Recommend physical therapy, rehabilitation exercises as per instructions. Meds ordered this encounter  Medications  . cyclobenzaprine (FLEXERIL) 5 MG tablet    Sig: Take 1 tablet (5 mg total) by mouth 3 (three) times daily as needed for muscle spasms.    Dispense:  20 tablet    Refill:  0    Order Specific Question:  Supervising Provider    Answer:  Linna Hoff [phone]  . diclofenac (CATAFLAM) 50 MG tablet    Sig: Take 1 tablet (50 mg total) by mouth 3 (three) times daily. One tablet TID with food prn pain.    Dispense:  21 tablet    Refill:  0    Order Specific Question:  Supervising Provider    Answer:  Linna Hoff [[id number]]   Weight loss, improve posture.    Hayden Rasmussen, NP 04/04/15 2040

## 2015-04-04 NOTE — ED Notes (Signed)
Back pain for 4 months 

## 2015-04-05 ENCOUNTER — Emergency Department (HOSPITAL_COMMUNITY)
Admission: EM | Admit: 2015-04-05 | Discharge: 2015-04-05 | Disposition: A | Discharge status: Home / Self Care | Payer: Medicaid Other | Attending: Emergency Medicine | Admitting: Emergency Medicine

## 2015-04-05 ENCOUNTER — Encounter (HOSPITAL_COMMUNITY): Payer: Self-pay | Admitting: *Deleted

## 2015-04-05 DIAGNOSIS — R51 Headache: Secondary | ICD-10-CM | POA: Insufficient documentation

## 2015-04-05 DIAGNOSIS — R42 Dizziness and giddiness: Secondary | ICD-10-CM | POA: Insufficient documentation

## 2015-04-05 DIAGNOSIS — R5383 Other fatigue: Secondary | ICD-10-CM | POA: Insufficient documentation

## 2015-04-05 DIAGNOSIS — F17200 Nicotine dependence, unspecified, uncomplicated: Secondary | ICD-10-CM | POA: Insufficient documentation

## 2015-04-05 DIAGNOSIS — Z791 Long term (current) use of non-steroidal anti-inflammatories (NSAID): Secondary | ICD-10-CM | POA: Insufficient documentation

## 2015-04-05 DIAGNOSIS — M7989 Other specified soft tissue disorders: Secondary | ICD-10-CM | POA: Insufficient documentation

## 2015-04-05 DIAGNOSIS — R030 Elevated blood-pressure reading, without diagnosis of hypertension: Secondary | ICD-10-CM | POA: Insufficient documentation

## 2015-04-05 DIAGNOSIS — J45909 Unspecified asthma, uncomplicated: Secondary | ICD-10-CM | POA: Insufficient documentation

## 2015-04-05 DIAGNOSIS — IMO0001 Reserved for inherently not codable concepts without codable children: Secondary | ICD-10-CM

## 2015-04-05 DIAGNOSIS — Z880 Allergy status to penicillin: Secondary | ICD-10-CM | POA: Insufficient documentation

## 2015-04-05 DIAGNOSIS — E669 Obesity, unspecified: Secondary | ICD-10-CM | POA: Insufficient documentation

## 2015-04-05 DIAGNOSIS — R0683 Snoring: Secondary | ICD-10-CM | POA: Insufficient documentation

## 2015-04-05 DIAGNOSIS — G8929 Other chronic pain: Secondary | ICD-10-CM | POA: Insufficient documentation

## 2015-04-05 HISTORY — DX: Obesity, unspecified: E66.9

## 2015-04-05 NOTE — ED Notes (Signed)
Pt reports going to [facility] yesterday for back pain but had hypertension and was told to come here for further eval. Denies any history of HTN. Having headache x 2 weeks.

## 2015-04-05 NOTE — ED Provider Notes (Signed)
CSN: [id number]     Arrival date & time 04/05/15  1826 History  By signing my name below, I, Placido Sou, attest that this documentation has been prepared under the direction and in the presence of Vaunda Gutterman, PA-C. Electronically Signed: Placido Sou, ED Scribe. 04/05/2015. 8:10 PM.    Chief Complaint  Patient presents with  . Hypertension   The history is provided by the patient. No language interpreter was used.   HPI Comments: Teresa Crane is a 35 y.o. female who presents to the Emergency Department due to a high BP onset earlier today (160/90 mmHg in triage). She notes a hx of chronic back pain and was seen at [facility] yesterday for these symptoms and was told that if her HTN exacerbated that she should come to the ED. Pt notes experiencing an associated episode of dizziness earlier today stating she currently feels "off balance" as well as a intermittent, mild, HA. She notes she typically checks her BP 2x per week with it being irregular lately with readings being (150/102 and 165/109 yesterday). Pt reports chronic, left greater than right, bilateral leg swelling which she says hasn't worsened and is due to standing for long periods during work. Her husband notes that she snores consistently and loudly at night. She denies n/v, fever, CP , SOB, weakness or any other associated symptoms at this time.    Past Medical History  Diagnosis Date  . Asthma   . Obesity    Past Surgical History  Procedure Laterality Date  . Knee surgery Left   . Cyst excision     History reviewed. No pertinent family history. Social History  Substance Use Topics  . Smoking status: Current Every Day Smoker  . Smokeless tobacco: None  . Alcohol Use: Yes     Comment: social   OB History    No data available     Review of Systems  Constitutional: Negative for fever.  Respiratory: Negative for shortness of breath.   Cardiovascular: Negative for chest pain.  Gastrointestinal: Negative for  nausea and vomiting.  Neurological: Positive for dizziness and headaches. Negative for weakness.    Allergies  Penicillins  Home Medications   Prior to Admission medications   Medication Sig Start Date End Date Taking? Authorizing Provider  cyclobenzaprine (FLEXERIL) 5 MG tablet Take 1 tablet (5 mg total) by mouth 3 (three) times daily as needed for muscle spasms. 04/04/15   Hayden Rasmussen, NP  diclofenac (CATAFLAM) 50 MG tablet Take 1 tablet (50 mg total) by mouth 3 (three) times daily. One tablet TID with food prn pain. 04/04/15   Hayden Rasmussen, NP   BP 160/90 mmHg  Pulse 92  Temp(Src) 98.2 F (36.8 C) (Oral)  Resp 18  SpO2 99%  LMP 03/16/2015    Physical Exam  Constitutional: She is oriented to person, place, and time. She appears well-developed and well-nourished.  HENT:  Head: Normocephalic and atraumatic.  Eyes: EOM are normal.  Neck: Normal range of motion.  Cardiovascular: Normal rate, regular rhythm and normal heart sounds.   Pulmonary/Chest: Effort normal and breath sounds normal. No respiratory distress. She has no wheezes. She has no rales.  Abdominal: Soft.  Musculoskeletal: Normal range of motion.  Neurological: She is alert and oriented to person, place, and time.  Skin: Skin is warm and dry.  Psychiatric: She has a normal mood and affect.  Nursing note and vitals reviewed.   ED Course  Procedures  DIAGNOSTIC STUDIES: Oxygen Saturation is 99% on  RA, normal by my interpretation.    COORDINATION OF CARE: 8:09 PM Discussed next steps and return precautions with pt. She understood and is agreeable to the plan.   Labs Review Labs Reviewed - No data to display  Imaging Review No results found.   EKG Interpretation None      MDM   Final diagnoses:  Elevated blood pressure   Patient emergency department because she is concerned about her blood pressure. She states that she went to urgent care for some back issues and they told her blood pressure was high  there. She states today she did not feel good and became concerned and decided to come to the ER to check on her blood pressure. She denies any associated complaints at this time. She denies any chest pain, no headache, however she states she does get headaches mainly in the mornings. She admits to snoring and being fatigued during the day. I suggested to her that she may need a sleep study. I decided do not think she needs any blood work. Her blood pressure here is 160/90, repeat 135/96. She has no evidence of end organ damage. Return precautions discussed. Patient will follow-up with primary care doctor.  Filed Vitals:   04/05/15 1835 04/05/15 2015  BP: 160/90 135/96  Pulse: 92 74  Temp: 98.2 F (36.8 C)   TempSrc: Oral   Resp: 18 16  SpO2: 99% 100%   I personally performed the services described in this documentation, which was scribed in my presence. The recorded information has been reviewed and is accurate.   Jaynie Crumble, PA-C 04/05/15 2016  Richardean Canal, MD 04/05/15 2312

## 2015-04-05 NOTE — Discharge Instructions (Signed)
Watch your diet closely. Exercise. Follow up with primary care doctor as soon as able. Return if worsening symptoms.    DASH Eating Plan DASH stands for "Dietary Approaches to Stop Hypertension." The DASH eating plan is a healthy eating plan that has been shown to reduce high blood pressure (hypertension). Additional health benefits may include reducing the risk of type 2 diabetes mellitus, heart disease, and stroke. The DASH eating plan may also help with weight loss. WHAT DO I NEED TO KNOW ABOUT THE DASH EATING PLAN? For the DASH eating plan, you will follow these general guidelines:  Choose foods with a percent daily value for sodium of less than 5% (as listed on the food label).  Use salt-free seasonings or herbs instead of table salt or sea salt.  Check with your health care provider or pharmacist before using salt substitutes.  Eat lower-sodium products, often labeled as "lower sodium" or "no salt added."  Eat fresh foods.  Eat more vegetables, fruits, and low-fat dairy products.  Choose whole grains. Look for the word "whole" as the first word in the ingredient list.  Choose fish and skinless chicken or [place] more often than red meat. Limit fish, poultry, and meat to 6 oz (170 g) each day.  Limit sweets, desserts, sugars, and sugary drinks.  Choose heart-healthy fats.  Limit cheese to 1 oz (28 g) per day.  Eat more home-cooked food and less restaurant, buffet, and fast food.  Limit fried foods.  Cook foods using methods other than frying.  Limit canned vegetables. If you do use them, rinse them well to decrease the sodium.  When eating at a restaurant, ask that your food be prepared with less salt, or no salt if possible. WHAT FOODS CAN I EAT? Seek help from a dietitian for individual calorie needs. Grains Whole grain or whole wheat bread. Brown rice. Whole grain or whole wheat pasta. Quinoa, bulgur, and whole grain cereals. Low-sodium cereals. Corn or whole wheat  flour tortillas. Whole grain cornbread. Whole grain crackers. Low-sodium crackers. Vegetables Fresh or frozen vegetables (raw, steamed, roasted, or grilled). Low-sodium or reduced-sodium tomato and vegetable juices. Low-sodium or reduced-sodium tomato sauce and paste. Low-sodium or reduced-sodium canned vegetables.  Fruits All fresh, canned (in natural juice), or frozen fruits. Meat and Other Protein Products Ground beef (85% or leaner), grass-fed beef, or beef trimmed of fat. Skinless chicken or [place]. Ground chicken or [place]. Pork trimmed of fat. All fish and seafood. Eggs. Dried beans, peas, or lentils. Unsalted nuts and seeds. Unsalted canned beans. Dairy Low-fat dairy products, such as skim or 1% milk, 2% or reduced-fat cheeses, low-fat ricotta or cottage cheese, or plain low-fat yogurt. Low-sodium or reduced-sodium cheeses. Fats and Oils Tub margarines without trans fats. Light or reduced-fat mayonnaise and salad dressings (reduced sodium). Avocado. Safflower, olive, or canola oils. Natural peanut or almond butter. Other Unsalted popcorn and pretzels. The items listed above may not be a complete list of recommended foods or beverages. Contact your dietitian for more options. WHAT FOODS ARE NOT RECOMMENDED? Grains White bread. White pasta. White rice. Refined cornbread. Bagels and croissants. Crackers that contain trans fat. Vegetables Creamed or fried vegetables. Vegetables in a cheese sauce. Regular canned vegetables. Regular canned tomato sauce and paste. Regular tomato and vegetable juices. Fruits Dried fruits. Canned fruit in light or heavy syrup. Fruit juice. Meat and Other Protein Products Fatty cuts of meat. Ribs, chicken wings, bacon, sausage, bologna, salami, chitterlings, fatback, hot dogs, bratwurst, and packaged luncheon meats. Salted  nuts and seeds. Canned beans with salt. Dairy Whole or 2% milk, cream, half-and-half, and cream cheese. Whole-fat or sweetened yogurt.  Full-fat cheeses or blue cheese. Nondairy creamers and whipped toppings. Processed cheese, cheese spreads, or cheese curds. Condiments Onion and garlic salt, seasoned salt, table salt, and sea salt. Canned and packaged gravies. Worcestershire sauce. Tartar sauce. Barbecue sauce. Teriyaki sauce. Soy sauce, including reduced sodium. Steak sauce. Fish sauce. Oyster sauce. Cocktail sauce. Horseradish. Ketchup and mustard. Meat flavorings and tenderizers. Bouillon cubes. Hot sauce. Tabasco sauce. Marinades. Taco seasonings. Relishes. Fats and Oils Butter, stick margarine, lard, shortening, ghee, and bacon fat. Coconut, palm kernel, or palm oils. Regular salad dressings. Other Pickles and olives. Salted popcorn and pretzels. The items listed above may not be a complete list of foods and beverages to avoid. Contact your dietitian for more information. WHERE CAN I FIND MORE INFORMATION? National Heart, Lung, and Blood Institute: [employer]   This information is not intended to replace advice given to you by your health care provider. Make sure you discuss any questions you have with your health care provider.   Document Released: 02/12/2011 Document Revised: 03/16/2014 Document Reviewed: 12/28/2012 Elsevier Interactive Patient Education 2016 [employer].    Managing Your High Blood Pressure Blood pressure is a measurement of how forceful your blood is pressing against the walls of the arteries. Arteries are muscular tubes within the circulatory system. Blood pressure does not stay the same. Blood pressure rises when you are active, excited, or nervous; and it lowers during sleep and relaxation. If the numbers measuring your blood pressure stay above normal most of the time, you are at risk for health problems. High blood pressure (hypertension) is a long-term (chronic) condition in which blood pressure is elevated. A blood pressure reading is recorded as two  numbers, such as 120 over 80 (or 120/80). The first, higher number is called the systolic pressure. It is a measure of the pressure in your arteries as the heart beats. The second, lower number is called the diastolic pressure. It is a measure of the pressure in your arteries as the heart relaxes between beats.  Keeping your blood pressure in a normal range is important to your overall health and prevention of health problems, such as heart disease and stroke. When your blood pressure is uncontrolled, your heart has to work harder than normal. High blood pressure is a very common condition in adults because blood pressure tends to rise with age. Men and women are equally likely to have hypertension but at different times in life. Before age 25, men are more likely to have hypertension. After 36 years of age, women are more likely to have it. Hypertension is especially common in African Americans. This condition often has no signs or symptoms. The cause of the condition is usually not known. Your caregiver can help you come up with a plan to keep your blood pressure in a normal, healthy range. BLOOD PRESSURE STAGES Blood pressure is classified into four stages: normal, prehypertension, stage 1, and stage 2. Your blood pressure reading will be used to determine what type of treatment, if any, is necessary. Appropriate treatment options are tied to these four stages:  Normal  Systolic pressure (mm Hg): below 120.  Diastolic pressure (mm Hg): below 80. Prehypertension  Systolic pressure (mm Hg): 120 to 139.  Diastolic pressure (mm Hg): 80 to 89. Stage1  Systolic pressure (mm Hg): 140 to 159.  Diastolic pressure (mm Hg): 90 to 99. Stage2  Systolic pressure (mm Hg): 160 or above.  Diastolic pressure (mm Hg): 100 or above. RISKS RELATED TO HIGH BLOOD PRESSURE Managing your blood pressure is an important responsibility. Uncontrolled high blood pressure can lead to:  A heart attack.  A  stroke.  A weakened blood vessel (aneurysm).  Heart failure.  Kidney damage.  Eye damage.  Metabolic syndrome.  Memory and concentration problems. HOW TO MANAGE YOUR BLOOD PRESSURE Blood pressure can be managed effectively with lifestyle changes and medicines (if needed). Your caregiver will help you come up with a plan to bring your blood pressure within a normal range. Your plan should include the following: Education  Read all information provided by your caregivers about how to control blood pressure.  Educate yourself on the latest guidelines and treatment recommendations. New research is always being done to further define the risks and treatments for high blood pressure. Lifestylechanges  Control your weight.  Avoid smoking.  Stay physically active.  Reduce the amount of salt in your diet.  Reduce stress.  Control any chronic conditions, such as high cholesterol or diabetes.  Reduce your alcohol intake. Medicines  Several medicines (antihypertensive medicines) are available, if needed, to bring blood pressure within a normal range. Communication  Review all the medicines you take with your caregiver because there may be side effects or interactions.  Talk with your caregiver about your diet, exercise habits, and other lifestyle factors that may be contributing to high blood pressure.  See your caregiver regularly. Your caregiver can help you create and adjust your plan for managing high blood pressure. RECOMMENDATIONS FOR TREATMENT AND FOLLOW-UP  The following recommendations are based on current guidelines for managing high blood pressure in nonpregnant adults. Use these recommendations to identify the proper follow-up period or treatment option based on your blood pressure reading. You can discuss these options with your caregiver.  Systolic pressure of 120 to 139 or diastolic pressure of 80 to 89: Follow up with your caregiver as directed.  Systolic  pressure of 140 to 160 or diastolic pressure of 90 to 100: Follow up with your caregiver within 2 months.  Systolic pressure above 160 or diastolic pressure above 100: Follow up with your caregiver within 1 month.  Systolic pressure above 180 or diastolic pressure above 110: Consider antihypertensive therapy; follow up with your caregiver within 1 week.  Systolic pressure above 200 or diastolic pressure above 120: Begin antihypertensive therapy; follow up with your caregiver within 1 week.   This information is not intended to replace advice given to you by your health care provider. Make sure you discuss any questions you have with your health care provider.   Document Released: 11/18/2011 Document Reviewed: 11/18/2011 Elsevier Interactive Patient Education [employer]

## 2015-04-05 NOTE — ED Notes (Signed)
Pt A&OX4, ambulatory at d/c with steady gait, NAD 

## 2015-04-29 ENCOUNTER — Emergency Department (INDEPENDENT_AMBULATORY_CARE_PROVIDER_SITE_OTHER): Payer: PRIVATE HEALTH INSURANCE

## 2015-04-29 ENCOUNTER — Emergency Department (INDEPENDENT_AMBULATORY_CARE_PROVIDER_SITE_OTHER)
Admission: EM | Admit: 2015-04-29 | Discharge: 2015-04-29 | Disposition: A | Discharge status: Home / Self Care | Payer: PRIVATE HEALTH INSURANCE | Source: Home / Self Care | Attending: Family Medicine | Admitting: Family Medicine

## 2015-04-29 DIAGNOSIS — M6588 Other synovitis and tenosynovitis, other site: Secondary | ICD-10-CM

## 2015-04-29 DIAGNOSIS — M779 Enthesopathy, unspecified: Principal | ICD-10-CM

## 2015-04-29 DIAGNOSIS — M778 Other enthesopathies, not elsewhere classified: Secondary | ICD-10-CM

## 2015-04-29 MED ORDER — DICLOFENAC SODIUM 1 % TD GEL: 2.0000 g | Freq: Four times a day (QID) | TRANSDERMAL | Status: DC

## 2015-04-29 NOTE — ED Provider Notes (Signed)
CSN: [id number]  [id number] date & time 04/29/15  1556 History   First MD Initiated Contact with Patient 04/29/15 1753     Chief Complaint  Patient presents with  . Hand Problem   (Consider location/radiation/quality/duration/timing/severity/associated sxs/prior Treatment) Patient is a 35 y.o. female presenting with hand pain. The history is provided by the patient.  Hand Pain This is a new problem. The current episode started 6 to 12 hours ago. The problem has been gradually worsening. Pertinent negatives include no chest pain and no abdominal pain. Exacerbated by: gripping     Past Medical History  Diagnosis Date  . Asthma   . Obesity    Past Surgical History  Procedure Laterality Date  . Knee surgery Left   . Cyst excision     No family history on file. Social History  Substance Use Topics  . Smoking status: Current Every Day Smoker  . Smokeless tobacco: Not on file  . Alcohol Use: Yes     Comment: social   OB History    No data available     Review of Systems  Constitutional: Negative.   Cardiovascular: Negative for chest pain.  Gastrointestinal: Negative for abdominal pain.  Musculoskeletal: Negative.  Negative for joint swelling.  Skin: Negative.   All other systems reviewed and are negative.   Allergies  Penicillins  Home Medications   Prior to Admission medications   Medication Sig Start Date End Date Taking? Authorizing Provider  cyclobenzaprine (FLEXERIL) 5 MG tablet Take 1 tablet (5 mg total) by mouth 3 (three) times daily as needed for muscle spasms. 04/04/15   Hayden Rasmussen, NP  diclofenac (CATAFLAM) 50 MG tablet Take 1 tablet (50 mg total) by mouth 3 (three) times daily. One tablet TID with food prn pain. 04/04/15   Hayden Rasmussen, NP  diclofenac sodium (VOLTAREN) 1 % GEL Apply 2 g topically 4 (four) times daily. Please instruct in dosing. 04/29/15   Linna Hoff, MD   Meds Ordered and Administered this Visit  Medications - No data to display  BP 142/82  mmHg  Pulse 78  Temp(Src) 98.6 F (37 C) (Oral)  Resp 20  SpO2 99%  LMP 04/10/2015 No data found.   Physical Exam  Constitutional: She is oriented to person, place, and time. She appears well-developed and well-nourished. No distress.  Musculoskeletal: She exhibits tenderness.  Left mcp joint soreness to palp and with gripping pressure rom.  Neurological: She is alert and oriented to person, place, and time.  Skin: Skin is warm and dry.  Nursing note and vitals reviewed.   ED Course  Procedures (including critical care time)  Labs Review Labs Reviewed - No data to display  Imaging Review Dg Finger Thumb Left  04/29/2015  CLINICAL DATA:  Pt woke with a lot of pain to thumb, she does a lot of work with her hands, no known injury EXAM: LEFT THUMB 2+V COMPARISON:  None. FINDINGS: There is no evidence of fracture or dislocation. There is no evidence of arthropathy or other focal bone abnormality. Soft tissues are unremarkable IMPRESSION: Negative. Electronically Signed   By: Corlis Leak M.D.   On: 04/29/2015 18:35     Visual Acuity Review  Right Eye Distance:   Left Eye Distance:   Bilateral Distance:    Right Eye Near:   Left Eye Near:    Bilateral Near:         MDM   1. Thumb tendonitis    Meds ordered  this encounter  Medications  . diclofenac sodium (VOLTAREN) 1 % GEL    Sig: Apply 2 g topically 4 (four) times daily. Please instruct in dosing.    Dispense:  100 g    Refill:  1       Linna Hoff, MD 04/29/15 [phone]

## 2015-04-29 NOTE — ED Notes (Signed)
Pt   Has  Pain  l  Thumb   Today   -   denys   Any  specefic  Injury    Pt   Reports  Uses  Her  Hands  A  Lot  At  Work

## 2015-04-29 NOTE — Discharge Instructions (Signed)
Ice pack, splint and medicine as prescribed, see orthopedist if further problems.

## 2015-04-30 ENCOUNTER — Telehealth (HOSPITAL_BASED_OUTPATIENT_CLINIC_OR_DEPARTMENT_OTHER): Payer: Self-pay | Admitting: Emergency Medicine

## 2017-02-26 ENCOUNTER — Encounter (HOSPITAL_COMMUNITY): Payer: Self-pay | Admitting: Emergency Medicine

## 2017-02-26 ENCOUNTER — Emergency Department (HOSPITAL_COMMUNITY): Payer: PRIVATE HEALTH INSURANCE

## 2017-02-26 ENCOUNTER — Emergency Department (HOSPITAL_COMMUNITY)
Admission: EM | Admit: 2017-02-26 | Discharge: 2017-02-26 | Disposition: A | Discharge status: Home / Self Care | Payer: PRIVATE HEALTH INSURANCE | Attending: Physician Assistant | Admitting: Physician Assistant

## 2017-02-26 ENCOUNTER — Other Ambulatory Visit: Payer: Self-pay

## 2017-02-26 DIAGNOSIS — F17200 Nicotine dependence, unspecified, uncomplicated: Secondary | ICD-10-CM | POA: Insufficient documentation

## 2017-02-26 DIAGNOSIS — J069 Acute upper respiratory infection, unspecified: Secondary | ICD-10-CM

## 2017-02-26 DIAGNOSIS — B9789 Other viral agents as the cause of diseases classified elsewhere: Secondary | ICD-10-CM | POA: Insufficient documentation

## 2017-02-26 DIAGNOSIS — J4521 Mild intermittent asthma with (acute) exacerbation: Secondary | ICD-10-CM | POA: Insufficient documentation

## 2017-02-26 DIAGNOSIS — Z720 Tobacco use: Secondary | ICD-10-CM

## 2017-02-26 MED ORDER — IPRATROPIUM BROMIDE 0.02 % IN SOLN: 0.5000 mg | Freq: Once | RESPIRATORY_TRACT | Status: DC

## 2017-02-26 MED ORDER — ALBUTEROL SULFATE (2.5 MG/3ML) 0.083% IN NEBU
5.0000 mg | INHALATION_SOLUTION | Freq: Once | RESPIRATORY_TRACT | Status: DC
Start: 2017-02-26 — End: 2017-02-26

## 2017-02-26 MED ORDER — IPRATROPIUM-ALBUTEROL 0.5-2.5 (3) MG/3ML IN SOLN
3.0000 mL | Freq: Four times a day (QID) | RESPIRATORY_TRACT | Status: DC
  Administered 2017-02-26: 3 mL via RESPIRATORY_TRACT
  Filled 2017-02-26: qty 3

## 2017-02-26 MED ORDER — ALBUTEROL SULFATE HFA 108 (90 BASE) MCG/ACT IN AERS
2.0000 | INHALATION_SPRAY | Freq: Once | RESPIRATORY_TRACT | Status: AC
  Administered 2017-02-26: 2 via RESPIRATORY_TRACT
  Filled 2017-02-26: qty 6.7

## 2017-02-26 MED ORDER — PREDNISONE 20 MG PO TABS: ORAL_TABLET | ORAL | 0 refills | Status: DC

## 2017-02-26 MED ORDER — ONDANSETRON 8 MG PO TBDP
8.0000 mg | ORAL_TABLET | Freq: Once | ORAL | Status: AC
  Administered 2017-02-26: 8 mg via ORAL
  Filled 2017-02-26: qty 1

## 2017-02-26 MED ORDER — ALBUTEROL SULFATE (2.5 MG/3ML) 0.083% IN NEBU
2.5000 mg | INHALATION_SOLUTION | Freq: Once | RESPIRATORY_TRACT | Status: AC
  Administered 2017-02-26: 2.5 mg via RESPIRATORY_TRACT
  Filled 2017-02-26: qty 3

## 2017-02-26 MED ORDER — PREDNISONE 20 MG PO TABS
60.0000 mg | ORAL_TABLET | Freq: Once | ORAL | Status: AC
  Administered 2017-02-26: 60 mg via ORAL
  Filled 2017-02-26: qty 3

## 2017-02-26 MED ORDER — ALBUTEROL SULFATE HFA 108 (90 BASE) MCG/ACT IN AERS: 1.0000 | INHALATION_SPRAY | Freq: Four times a day (QID) | RESPIRATORY_TRACT | 0 refills | Status: AC | PRN

## 2017-02-26 NOTE — ED Notes (Signed)
Pt verbalizes understanding of d/c paperwork, follow up instructions, and medications. Pt A/O x4, ambulatory. All belongings with patient upon departure.  

## 2017-02-26 NOTE — ED Notes (Signed)
Respiratory called for duoneb  

## 2017-02-26 NOTE — ED Triage Notes (Addendum)
Pt complaint of nonproductive cough for a week unrelieved by OTC medications. Mask applied.

## 2017-02-26 NOTE — Discharge Instructions (Signed)
Continue to stay well-hydrated. Use Mucinex for cough suppression/expectoration of mucus. Use over the counter antihistamines such as zyrtec, claritin, or allegra to decrease symptoms and frequency of asthma attacks. Use inhaler as directed, as needed for cough/chest congestion/wheezing/shortness of breath/etc. Take prednisone as directed for your asthma exacerbation, starting tomorrow since you received today's dose in the ER today. STOP SMOKING!! [facility] 5-7 days for recheck of ongoing symptoms and to establish medical care. Return to emergency department for emergent changing or worsening of symptoms.

## 2017-02-26 NOTE — ED Notes (Signed)
Respiratory at bedside.

## 2017-02-26 NOTE — ED Provider Notes (Signed)
[facility]-EMERGENCY DEPT Provider Note   CSN: [id number] Arrival date & time: 02/26/17  1124     History   Chief Complaint Chief Complaint  Patient presents with  . Cough    HPI Teresa Crane is a 36 y.o. female with a PMHx of asthma, who presents to the ED with complaints of sinus congestion and rhinorrhea and dry cough x 1 week.  Patient states that she started out having sinus congestion, was trying Sudafed with no relief of her symptoms, and then gradually began having a cough.  She used Mucinex with no relief.  No known aggravating factors.  She reports associated wheezing, chest tightness, and ear fullness when blowing her nose.  She admits to being a cigarette smoker.  No known sick contacts, did not receive a flu shot this year.  She denies any fevers, chills, ear pain or drainage, hemoptysis, sore throat, CP, SOB, abd pain, N/V/D/C, hematuria, dysuria, myalgias, arthralgias, numbness, tingling, focal weakness, or any other complaints at this time.    The history is provided by the patient and medical records. No language interpreter was used.  Cough  This is a new problem. The current episode started more than 2 days ago. The problem occurs constantly. The problem has not changed since onset.The cough is non-productive. There has been no fever. Associated symptoms include ear congestion, rhinorrhea and wheezing. Pertinent negatives include no chest pain, no chills, no ear pain, no sore throat, no myalgias and no shortness of breath. She has tried decongestants for the symptoms. The treatment provided no relief. She is a smoker. Her past medical history is significant for asthma.    Past Medical History:  Diagnosis Date  . Asthma   . Obesity     There are no active problems to display for this patient.   Past Surgical History:  Procedure Laterality Date  . CYST EXCISION    . KNEE SURGERY Left     OB History    No data available        Home Medications    Prior to Admission medications   Medication Sig Start Date End Date Taking? Authorizing Provider  acetaminophen (TYLENOL) 500 MG tablet Take 1,000 mg by mouth daily as needed for headache (PAIN).   Yes [provider]  albuterol (PROAIR HFA) 108 (90 Base) MCG/ACT inhaler Inhale 2 puffs into the lungs daily as needed for wheezing or shortness of breath.   Yes [provider]  ibuprofen (ADVIL,MOTRIN) 200 MG tablet Take 400 mg by mouth daily as needed for headache or cramping.   Yes [provider]  Multiple Vitamins-Calcium (ONE-A-DAY WOMENS PO) Take 1 tablet by mouth daily.   Yes [provider]  Phenylephrine-DM-GG-APAP (MUCINEX FAST-MAX COLD FLU PO) Take 20 mLs by mouth every 4 (four) hours as needed (COLD/FLU).   Yes [provider]  pseudoephedrine (SUDAFED) 120 MG 12 hr tablet Take 120 mg by mouth 2 (two) times daily.   Yes [provider]  pseudoephedrine (SUDAFED) 30 MG tablet Take 30 mg by mouth every 4 (four) hours as needed for congestion.   Yes [provider]  cyclobenzaprine (FLEXERIL) 5 MG tablet Take 1 tablet (5 mg total) by mouth 3 (three) times daily as needed for muscle spasms. Patient not taking: Reported on 02/26/2017 04/04/15   Hayden RasmussenMabe, David, NP  diclofenac (CATAFLAM) 50 MG tablet Take 1 tablet (50 mg total) by mouth 3 (three) times daily. One tablet TID with food prn  pain. Patient not taking: Reported on 02/26/2017 04/04/15   Hayden RasmussenMabe, David, NP  diclofenac sodium (VOLTAREN) 1 % GEL Apply 2 g topically 4 (four) times daily. Please instruct in dosing. Patient not taking: Reported on 02/26/2017 04/29/15   Linna HoffKindl, James D, MD    Family History No family history on file.  Social History Social History   Tobacco Use  . Smoking status: Current Every Day Smoker  Substance Use Topics  . Alcohol use: Yes    Comment: social  . Drug use: No     Allergies   Penicillins   Review of  Systems Review of Systems  Constitutional: Negative for chills and fever.  HENT: Positive for congestion and rhinorrhea. Negative for ear discharge, ear pain and sore throat.   Respiratory: Positive for cough, chest tightness and wheezing. Negative for shortness of breath.   Cardiovascular: Negative for chest pain.  Gastrointestinal: Negative for abdominal pain, constipation, diarrhea, nausea and vomiting.  Genitourinary: Negative for dysuria and hematuria.  Musculoskeletal: Negative for arthralgias and myalgias.  Skin: Negative for color change.  Allergic/Immunologic: Negative for immunocompromised state.  Neurological: Negative for weakness and numbness.  Psychiatric/Behavioral: Negative for confusion.   All other systems reviewed and are negative for acute change except as noted in the HPI.    Physical Exam Updated Vital Signs BP (!) 164/107 (BP Location: Right Arm)   Pulse 81   Temp 98.2 F (36.8 C) (Oral)   Resp 16   LMP 02/06/2017   SpO2 99%   Physical Exam  Constitutional: She is oriented to person, place, and time. Vital signs are normal. She appears well-developed and well-nourished.  Non-toxic appearance. No distress.  Afebrile, nontoxic, NAD, HTN noted similar to prior visits  HENT:  Head: Normocephalic and atraumatic.  Right Ear: Hearing, tympanic membrane, external ear and ear canal normal.  Left Ear: Hearing, tympanic membrane, external ear and ear canal normal.  Nose: Mucosal edema present.  Mouth/Throat: Uvula is midline, oropharynx is clear and moist and mucous membranes are normal. No trismus in the jaw. No uvula swelling. Tonsils are 0 on the right. Tonsils are 0 on the left. No tonsillar exudate.  Ears are clear bilaterally. Nose congested. Oropharynx clear and moist, without uvular swelling or deviation, no trismus or drooling, no tonsillar swelling or erythema, no exudates.    Eyes: Conjunctivae and EOM are normal. Right eye exhibits no discharge. Left eye  exhibits no discharge.  Neck: Normal range of motion. Neck supple.  Cardiovascular: Normal rate, regular rhythm, normal heart sounds and intact distal pulses. Exam reveals no gallop and no friction rub.  No murmur heard. RRR, nl s1/s2, no m/r/g, distal pulses intact, no pedal edema   Pulmonary/Chest: Effort normal. No respiratory distress. She has no decreased breath sounds. She has wheezes. She has no rhonchi. She has no rales.  Faint expiratory wheezing in all lung fields, no rhonchi/rales, no hypoxia or increased WOB, speaking in full sentences, SpO2 99% on RA   Abdominal: Soft. Normal appearance and bowel sounds are normal. She exhibits no distension. There is no tenderness. There is no rigidity, no rebound, no guarding, no CVA tenderness, no tenderness at McBurney's point and negative Murphy's sign.  Musculoskeletal: Normal range of motion.  Neurological: She is alert and oriented to person, place, and time. She has normal strength. No sensory deficit.  Skin: Skin is warm, dry and intact. No rash noted.  Psychiatric: She has a normal mood and affect.  Nursing note and vitals reviewed.  ED Treatments / Results  Labs (all labs ordered are listed, but only abnormal results are displayed) Labs Reviewed - No data to display  EKG  EKG Interpretation None       Radiology Dg Chest 2 View  Result Date: 02/26/2017 CLINICAL DATA:  Nonproductive cough for week. History of asthma, smoker. EXAM: CHEST  2 VIEW COMPARISON:  None. FINDINGS: Study is slightly hypoinspiratory with crowding of the central bronchovascular markings. Given the slightly low lung volumes, lungs appear clear. No evidence of pneumonia. No pleural effusion or pneumothorax seen. Heart size and mediastinal contours are within normal limits. Osseous structures about the chest are unremarkable. IMPRESSION: Slightly low lung volumes. No active cardiopulmonary disease. No evidence of pneumonia or pulmonary edema. Electronically  Signed   By: Bary Richard M.D.   On: 02/26/2017 11:50    Procedures Procedures (including critical care time)  Medications Ordered in ED Medications  ipratropium-albuterol (DUONEB) 0.5-2.5 (3) MG/3ML nebulizer solution 3 mL (3 mLs Nebulization Given 02/26/17 1353)  predniSONE (DELTASONE) tablet 60 mg (60 mg Oral Given 02/26/17 1335)  albuterol (PROVENTIL) (2.5 MG/3ML) 0.083% nebulizer solution 2.5 mg (2.5 mg Nebulization Given 02/26/17 1353)  ondansetron (ZOFRAN-ODT) disintegrating tablet 8 mg (8 mg Oral Given 02/26/17 1336)  albuterol (PROVENTIL HFA;VENTOLIN HFA) 108 (90 Base) MCG/ACT inhaler 2 puff (2 puffs Inhalation Given 02/26/17 1449)     Initial Impression / Assessment and Plan / ED Course  I have reviewed the triage vital signs and the nursing notes.  Pertinent labs & imaging results that were available during my care of the patient were reviewed by me and considered in my medical decision making (see chart for details).     36 y.o. female here with cough and URI symptoms x1wk. on exam, faint expiratory wheezing throughout, no rhonchi or rales.  No hypoxia or tachycardia, no other concerning exam findings.  Chest x-ray negative for pneumonia.  Will administer prednisone and DuoNeb, as this is likely an asthma exacerbation.  Will reassess shortly.  2:50 PM Pt's lung sounds greatly improved after duoneb, pt feeling much better. Will send home with inhaler and refill rx, prednisone x4 days starting tomorrow, advised use of daily antihistamine, and other OTC remedies for symptomatic relief. F/up with PCP in 5-7 days for recheck of symptoms. Tobacco cessation strongly encouraged, counseled for approx 5-58mins regarding this. I explained the diagnosis and have given explicit precautions to return to the ER including for any other new or worsening symptoms. The patient understands and accepts the medical plan as it's been dictated and I have answered their questions. Discharge  instructions concerning home care and prescriptions have been given. The patient is STABLE and is discharged to home in good condition.    Final Clinical Impressions(s) / ED Diagnoses   Final diagnoses:  Mild intermittent asthma with exacerbation  Viral URI with cough  Tobacco user    ED Discharge Orders        Ordered    albuterol (PROVENTIL HFA;VENTOLIN HFA) 108 (90 Base) MCG/ACT inhaler  Every 6 hours PRN     02/26/17 1500    predniSONE (DELTASONE) 20 MG tablet     02/26/17 [address] [place], [place] 02/26/17 1501    Abelino Derrick, MD 02/26/17 2052

## 2018-04-28 ENCOUNTER — Observation Stay (HOSPITAL_COMMUNITY)
Admission: EM | Admit: 2018-04-28 | Discharge: 2018-04-28 | Disposition: A | Discharge status: Home / Self Care | Payer: Self-pay | Attending: Orthopedic Surgery | Admitting: Orthopedic Surgery

## 2018-04-28 ENCOUNTER — Other Ambulatory Visit: Payer: Self-pay

## 2018-04-28 ENCOUNTER — Emergency Department (HOSPITAL_COMMUNITY): Payer: Self-pay | Admitting: Certified Registered Nurse Anesthetist

## 2018-04-28 ENCOUNTER — Emergency Department (HOSPITAL_COMMUNITY): Payer: Self-pay

## 2018-04-28 ENCOUNTER — Encounter (HOSPITAL_COMMUNITY): Payer: Self-pay | Admitting: Certified Registered Nurse Anesthetist

## 2018-04-28 ENCOUNTER — Encounter (HOSPITAL_COMMUNITY)
Admission: EM | Disposition: A | Discharge status: Home / Self Care | Payer: Self-pay | Source: Home / Self Care | Attending: Emergency Medicine

## 2018-04-28 DIAGNOSIS — I10 Essential (primary) hypertension: Secondary | ICD-10-CM | POA: Insufficient documentation

## 2018-04-28 DIAGNOSIS — S82831A Other fracture of upper and lower end of right fibula, initial encounter for closed fracture: Secondary | ICD-10-CM

## 2018-04-28 DIAGNOSIS — E669 Obesity, unspecified: Secondary | ICD-10-CM | POA: Insufficient documentation

## 2018-04-28 DIAGNOSIS — S93431A Sprain of tibiofibular ligament of right ankle, initial encounter: Secondary | ICD-10-CM | POA: Insufficient documentation

## 2018-04-28 DIAGNOSIS — Z880 Allergy status to penicillin: Secondary | ICD-10-CM | POA: Insufficient documentation

## 2018-04-28 DIAGNOSIS — Z6841 Body Mass Index (BMI) 40.0 and over, adult: Secondary | ICD-10-CM | POA: Insufficient documentation

## 2018-04-28 DIAGNOSIS — W19XXXA Unspecified fall, initial encounter: Secondary | ICD-10-CM

## 2018-04-28 DIAGNOSIS — S82841A Displaced bimalleolar fracture of right lower leg, initial encounter for closed fracture: Principal | ICD-10-CM | POA: Insufficient documentation

## 2018-04-28 DIAGNOSIS — J45909 Unspecified asthma, uncomplicated: Secondary | ICD-10-CM | POA: Insufficient documentation

## 2018-04-28 DIAGNOSIS — W010XXA Fall on same level from slipping, tripping and stumbling without subsequent striking against object, initial encounter: Secondary | ICD-10-CM | POA: Insufficient documentation

## 2018-04-28 DIAGNOSIS — S9304XA Dislocation of right ankle joint, initial encounter: Secondary | ICD-10-CM

## 2018-04-28 DIAGNOSIS — Z79899 Other long term (current) drug therapy: Secondary | ICD-10-CM | POA: Insufficient documentation

## 2018-04-28 DIAGNOSIS — S82891A Other fracture of right lower leg, initial encounter for closed fracture: Secondary | ICD-10-CM

## 2018-04-28 DIAGNOSIS — F17200 Nicotine dependence, unspecified, uncomplicated: Secondary | ICD-10-CM | POA: Insufficient documentation

## 2018-04-28 HISTORY — PX: ORIF ANKLE FRACTURE: SHX5408

## 2018-04-28 LAB — I-STAT BETA HCG BLOOD, ED (MC, WL, AP ONLY): I-stat hCG, quantitative: <5 m[IU]/mL (ref <5)

## 2018-04-28 SURGERY — OPEN REDUCTION INTERNAL FIXATION (ORIF) ANKLE FRACTURE
Anesthesia: General | Site: Ankle | Laterality: Right

## 2018-04-28 MED ORDER — LACTATED RINGERS IV SOLN
INTRAVENOUS | Status: DC | PRN
  Administered 2018-04-28 (×2): via INTRAVENOUS

## 2018-04-28 MED ORDER — POVIDONE-IODINE 10 % EX SWAB
2.0000 "application " | Freq: Once | CUTANEOUS | Status: AC
  Administered 2018-04-28: 2 via TOPICAL

## 2018-04-28 MED ORDER — ONDANSETRON HCL 4 MG/2ML IJ SOLN
4.0000 mg | Freq: Once | INTRAMUSCULAR | Status: AC
  Administered 2018-04-28: 4 mg via INTRAVENOUS
  Filled 2018-04-28: qty 2

## 2018-04-28 MED ORDER — PROPOFOL 10 MG/ML IV BOLUS
INTRAVENOUS | Status: DC | PRN
  Administered 2018-04-28: 200 mg via INTRAVENOUS
  Administered 2018-04-28: 5 mg via INTRAVENOUS

## 2018-04-28 MED ORDER — MIDAZOLAM HCL 2 MG/2ML IJ SOLN
INTRAMUSCULAR | Status: AC
  Administered 2018-04-28: 4 mg via INTRAVENOUS
  Filled 2018-04-28: qty 4

## 2018-04-28 MED ORDER — KETAMINE HCL 50 MG/5ML IJ SOSY
1.0000 mg/kg | PREFILLED_SYRINGE | Freq: Once | INTRAMUSCULAR | Status: AC
  Administered 2018-04-28: 50 mg via INTRAVENOUS
  Filled 2018-04-28: qty 15

## 2018-04-28 MED ORDER — HYDROMORPHONE HCL 1 MG/ML IJ SOLN: 0.2500 mg | INTRAMUSCULAR | Status: DC | PRN

## 2018-04-28 MED ORDER — CLINDAMYCIN PHOSPHATE 900 MG/50ML IV SOLN
900.0000 mg | INTRAVENOUS | Status: AC
  Administered 2018-04-28: 900 mg via INTRAVENOUS

## 2018-04-28 MED ORDER — ONDANSETRON HCL 4 MG/2ML IJ SOLN
INTRAMUSCULAR | Status: DC | PRN
  Administered 2018-04-28: 4 mg via INTRAVENOUS

## 2018-04-28 MED ORDER — MIDAZOLAM HCL 2 MG/2ML IJ SOLN
INTRAMUSCULAR | Status: AC
  Filled 2018-04-28: qty 2

## 2018-04-28 MED ORDER — CHLORHEXIDINE GLUCONATE 4 % EX LIQD: 60.0000 mL | Freq: Once | CUTANEOUS | Status: DC

## 2018-04-28 MED ORDER — OXYCODONE HCL 5 MG PO TABS: 5.0000 mg | ORAL_TABLET | Freq: Once | ORAL | Status: DC | PRN

## 2018-04-28 MED ORDER — PROMETHAZINE HCL 25 MG/ML IJ SOLN: 6.2500 mg | INTRAMUSCULAR | Status: DC | PRN

## 2018-04-28 MED ORDER — FENTANYL CITRATE (PF) 250 MCG/5ML IJ SOLN
INTRAMUSCULAR | Status: AC
  Filled 2018-04-28: qty 5

## 2018-04-28 MED ORDER — ONDANSETRON HCL 4 MG PO TABS: 4.0000 mg | ORAL_TABLET | Freq: Three times a day (TID) | ORAL | 0 refills | Status: DC | PRN

## 2018-04-28 MED ORDER — OXYCODONE HCL 5 MG PO TABS: 5.0000 mg | ORAL_TABLET | ORAL | 0 refills | Status: DC | PRN

## 2018-04-28 MED ORDER — HYDROMORPHONE HCL 1 MG/ML IJ SOLN
1.0000 mg | Freq: Once | INTRAMUSCULAR | Status: AC
  Administered 2018-04-28: 1 mg via INTRAVENOUS
  Filled 2018-04-28: qty 1

## 2018-04-28 MED ORDER — MEPERIDINE HCL 50 MG/ML IJ SOLN: 6.2500 mg | INTRAMUSCULAR | Status: DC | PRN

## 2018-04-28 MED ORDER — FENTANYL CITRATE (PF) 100 MCG/2ML IJ SOLN
INTRAMUSCULAR | Status: AC
  Administered 2018-04-28: 200 ug via INTRAVENOUS
  Filled 2018-04-28: qty 4

## 2018-04-28 MED ORDER — ROPIVACAINE HCL 7.5 MG/ML IJ SOLN
INTRAMUSCULAR | Status: DC | PRN
  Administered 2018-04-28 (×4): 5 mL via PERINEURAL

## 2018-04-28 MED ORDER — DEXAMETHASONE SODIUM PHOSPHATE 4 MG/ML IJ SOLN
INTRAMUSCULAR | Status: DC | PRN
  Administered 2018-04-28: 5 mg via INTRAVENOUS

## 2018-04-28 MED ORDER — PROPOFOL 10 MG/ML IV BOLUS
INTRAVENOUS | Status: AC
  Filled 2018-04-28: qty 20

## 2018-04-28 MED ORDER — 0.9 % SODIUM CHLORIDE (POUR BTL) OPTIME
TOPICAL | Status: DC | PRN
  Administered 2018-04-28: 1000 mL

## 2018-04-28 MED ORDER — FENTANYL CITRATE (PF) 100 MCG/2ML IJ SOLN
INTRAMUSCULAR | Status: DC | PRN
  Administered 2018-04-28: 50 ug via INTRAVENOUS

## 2018-04-28 MED ORDER — MIDAZOLAM HCL 2 MG/2ML IJ SOLN
4.0000 mg | Freq: Once | INTRAMUSCULAR | Status: AC
  Administered 2018-04-28: 4 mg via INTRAVENOUS

## 2018-04-28 MED ORDER — PROPOFOL 10 MG/ML IV BOLUS
1.0000 mg/kg | Freq: Once | INTRAVENOUS | Status: AC
  Administered 2018-04-28: 50 mg via INTRAVENOUS
  Filled 2018-04-28: qty 20

## 2018-04-28 MED ORDER — FENTANYL CITRATE (PF) 100 MCG/2ML IJ SOLN
200.0000 ug | Freq: Once | INTRAMUSCULAR | Status: AC
  Administered 2018-04-28: 200 ug via INTRAVENOUS

## 2018-04-28 MED ORDER — LIDOCAINE 2% (20 MG/ML) 5 ML SYRINGE
INTRAMUSCULAR | Status: DC | PRN
  Administered 2018-04-28: 100 mg via INTRAVENOUS

## 2018-04-28 MED ORDER — SENNA-DOCUSATE SODIUM 8.6-50 MG PO TABS: 2.0000 | ORAL_TABLET | Freq: Every day | ORAL | 1 refills | Status: DC

## 2018-04-28 MED ORDER — ONDANSETRON HCL 4 MG/2ML IJ SOLN: INTRAMUSCULAR | Status: DC | PRN

## 2018-04-28 MED ORDER — BUPIVACAINE HCL (PF) 0.25 % IJ SOLN
INTRAMUSCULAR | Status: AC
  Filled 2018-04-28: qty 30

## 2018-04-28 MED ORDER — CLINDAMYCIN PHOSPHATE 900 MG/50ML IV SOLN
INTRAVENOUS | Status: AC
  Filled 2018-04-28: qty 50

## 2018-04-28 MED ORDER — OXYCODONE HCL 5 MG/5ML PO SOLN: 5.0000 mg | Freq: Once | ORAL | Status: DC | PRN

## 2018-04-28 SURGICAL SUPPLY — 96 items
APL SKNCLS STERI-STRIP NONHPOA (GAUZE/BANDAGES/DRESSINGS)
BANDAGE ACE 4X5 VEL STRL LF (GAUZE/BANDAGES/DRESSINGS) IMPLANT
BANDAGE ACE 6X5 VEL STRL LF (GAUZE/BANDAGES/DRESSINGS) IMPLANT
BANDAGE ELASTIC 4 VELCRO ST LF (GAUZE/BANDAGES/DRESSINGS) ×3 IMPLANT
BANDAGE ELASTIC 6 VELCRO ST LF (GAUZE/BANDAGES/DRESSINGS) ×2 IMPLANT
BANDAGE ESMARK 6X9 LF (GAUZE/BANDAGES/DRESSINGS) IMPLANT
BENZOIN TINCTURE PRP APPL 2/3 (GAUZE/BANDAGES/DRESSINGS) ×1 IMPLANT
BIT DRILL 110X2.5XQCK CNCT (BIT) IMPLANT
BIT DRILL 2.5 (BIT) ×3
BIT DRILL 2.7MMX100MM LONG (BIT) ×1
BIT DRILL 2.7X100 LONG (BIT) ×2 IMPLANT
BIT DRILL STD 2.0MM (DRILL) ×1 IMPLANT
BIT DRL 110X2.5XQCK CNCT (BIT) ×1
BNDG CMPR 9X4 STRL LF SNTH (GAUZE/BANDAGES/DRESSINGS) ×1
BNDG CMPR 9X6 STRL LF SNTH (GAUZE/BANDAGES/DRESSINGS)
BNDG COHESIVE 4X5 TAN STRL (GAUZE/BANDAGES/DRESSINGS) ×4 IMPLANT
BNDG ESMARK 4X9 LF (GAUZE/BANDAGES/DRESSINGS) ×2 IMPLANT
BNDG ESMARK 6X9 LF (GAUZE/BANDAGES/DRESSINGS)
BOOTCOVER CLEANROOM LRG (PROTECTIVE WEAR) ×6 IMPLANT
CANISTER SUCT 3000ML PPV (MISCELLANEOUS) ×3 IMPLANT
CLOSURE STERI-STRIP 1/2X4 (GAUZE/BANDAGES/DRESSINGS) ×1
CLSR STERI-STRIP ANTIMIC 1/2X4 (GAUZE/BANDAGES/DRESSINGS) ×2 IMPLANT
COVER SURGICAL LIGHT HANDLE (MISCELLANEOUS) ×3 IMPLANT
COVER WAND RF STERILE (DRAPES) ×1 IMPLANT
CUFF TOURNIQUET SINGLE 34IN LL (TOURNIQUET CUFF) ×3 IMPLANT
CUFF TOURNIQUET SINGLE 44IN (TOURNIQUET CUFF) IMPLANT
DECANTER SPIKE VIAL GLASS SM (MISCELLANEOUS) IMPLANT
DRAPE C-ARM 42X72 X-RAY (DRAPES) ×3 IMPLANT
DRAPE C-ARMOR (DRAPES) IMPLANT
DRAPE INCISE IOBAN 66X45 STRL (DRAPES) IMPLANT
DRAPE U-SHAPE 47X51 STRL (DRAPES) ×2 IMPLANT
DRILL STANDARD 2.0MM (DRILL) ×3
DRSG PAD ABDOMINAL 8X10 ST (GAUZE/BANDAGES/DRESSINGS) ×1 IMPLANT
DRSG TEGADERM 2-3/8X2-3/4 SM (GAUZE/BANDAGES/DRESSINGS) ×3 IMPLANT
DURAPREP 26ML APPLICATOR (WOUND CARE) ×5 IMPLANT
ELECT REM PT RETURN 9FT ADLT (ELECTROSURGICAL) ×3
ELECTRODE REM PT RTRN 9FT ADLT (ELECTROSURGICAL) ×1 IMPLANT
GAUZE SPONGE 4X4 12PLY STRL (GAUZE/BANDAGES/DRESSINGS) IMPLANT
GAUZE SPONGE 4X4 12PLY STRL LF (GAUZE/BANDAGES/DRESSINGS) ×2 IMPLANT
GAUZE XEROFORM 1X8 LF (GAUZE/BANDAGES/DRESSINGS) ×4 IMPLANT
GLOVE BIO SURGEON STRL SZ7 (GLOVE) ×4 IMPLANT
GLOVE BIOGEL PI IND STRL 6.5 (GLOVE) IMPLANT
GLOVE BIOGEL PI IND STRL 7.0 (GLOVE) ×2 IMPLANT
GLOVE BIOGEL PI IND STRL 8 (GLOVE) IMPLANT
GLOVE BIOGEL PI INDICATOR 6.5 (GLOVE) ×2
GLOVE BIOGEL PI INDICATOR 7.0 (GLOVE) ×4
GLOVE BIOGEL PI INDICATOR 8 (GLOVE) ×2
GLOVE BIOGEL PI ORTHO PRO SZ8 (GLOVE) ×2
GLOVE ECLIPSE 7.5 STRL STRAW (GLOVE) ×4 IMPLANT
GLOVE ORTHO TXT STRL SZ7.5 (GLOVE) ×5 IMPLANT
GLOVE PI ORTHO PRO STRL SZ8 (GLOVE) ×2 IMPLANT
GLOVE SURG ORTHO 8.0 STRL STRW (GLOVE) ×9 IMPLANT
GOWN STRL REUS W/ TWL LRG LVL3 (GOWN DISPOSABLE) ×1 IMPLANT
GOWN STRL REUS W/ TWL XL LVL3 (GOWN DISPOSABLE) ×2 IMPLANT
GOWN STRL REUS W/TWL LRG LVL3 (GOWN DISPOSABLE) ×12
GOWN STRL REUS W/TWL XL LVL3 (GOWN DISPOSABLE) ×9
K-WIRE ACE 1.6X6 (WIRE) ×3
KIT BASIN OR (CUSTOM PROCEDURE TRAY) ×3 IMPLANT
KIT TURNOVER KIT B (KITS) ×3 IMPLANT
KWIRE ACE 1.6X6 (WIRE) IMPLANT
MANIFOLD NEPTUNE II (INSTRUMENTS) ×1 IMPLANT
NS IRRIG 1000ML POUR BTL (IV SOLUTION) ×3 IMPLANT
PACK ORTHO EXTREMITY (CUSTOM PROCEDURE TRAY) ×3 IMPLANT
PAD ABD 8X10 STRL (GAUZE/BANDAGES/DRESSINGS) ×6 IMPLANT
PAD ARMBOARD 7.5X6 YLW CONV (MISCELLANEOUS) ×3 IMPLANT
PAD CAST 4YDX4 CTTN HI CHSV (CAST SUPPLIES) ×1 IMPLANT
PADDING CAST COTTON 4X4 STRL (CAST SUPPLIES) ×3
PLATE FIBULA DISTAL 6H RIGHT (Plate) ×2 IMPLANT
PUTTY DBM STAGRAFT PLUS 5CC (Putty) ×3 IMPLANT
SCREW 3.5X12 (Screw) ×3 IMPLANT
SCREW BN 2.7X12X3.5XST NS (Screw) ×1 IMPLANT
SCREW CORT ST 3.5X40MM (Screw) ×2 IMPLANT
SCREW CORTICAL 2.7X18MM (Screw) ×2 IMPLANT
SCREW CORTICAL NONLOCK 3.5X50 (Screw) ×2 IMPLANT
SCREW LOCK 14X2.7X NS (Screw) IMPLANT
SCREW LOCK 16X2.7X (Screw) IMPLANT
SCREW LOCKING 2.7X14 (Screw) ×3 IMPLANT
SCREW LOCKING 2.7X16 (Screw) ×9 IMPLANT
SCREW PERI 3.5X14MM W/2.7 (Screw) ×4 IMPLANT
SPONGE LAP 4X18 RFD (DISPOSABLE) ×3 IMPLANT
SUCTION FRAZIER HANDLE 10FR (MISCELLANEOUS) ×2
SUCTION TUBE FRAZIER 10FR DISP (MISCELLANEOUS) ×1 IMPLANT
SUT MNCRL AB 3-0 PS2 18 (SUTURE) ×3 IMPLANT
SUT VIC AB 0 CT1 27 (SUTURE) ×3
SUT VIC AB 0 CT1 27XBRD ANBCTR (SUTURE) ×1 IMPLANT
SUT VIC AB 2-0 CT1 27 (SUTURE) ×3
SUT VIC AB 2-0 CT1 TAPERPNT 27 (SUTURE) ×1 IMPLANT
SUT VIC AB 3-0 SH 8-18 (SUTURE) ×3 IMPLANT
SYR CONTROL 10ML LL (SYRINGE) IMPLANT
TOWEL OR 17X24 6PK STRL BLUE (TOWEL DISPOSABLE) ×3 IMPLANT
TOWEL OR 17X26 10 PK STRL BLUE (TOWEL DISPOSABLE) ×3 IMPLANT
TUBE CONNECTING 12'X1/4 (SUCTIONS) ×1
TUBE CONNECTING 12X1/4 (SUCTIONS) ×2 IMPLANT
UNDERPAD 30X30 (UNDERPADS AND DIAPERS) ×3 IMPLANT
WATER STERILE IRR 1000ML POUR (IV SOLUTION) IMPLANT
YANKAUER SUCT BULB TIP NO VENT (SUCTIONS) IMPLANT

## 2018-04-28 NOTE — Anesthesia Preprocedure Evaluation (Addendum)
Anesthesia Evaluation  Patient identified by MRN, date of birth, ID band Patient awake    Reviewed: Allergy & Precautions, NPO status , Patient's Chart, lab work & pertinent test results  Airway Mallampati: III       Dental no notable dental hx. (+) Teeth Intact   Pulmonary asthma , Current Smoker,    Pulmonary exam normal breath sounds clear to auscultation       Cardiovascular hypertension,  Rhythm:Regular Rate:Tachycardia     Neuro/Psych    GI/Hepatic   Endo/Other  Morbid obesity  Renal/GU      Musculoskeletal   Abdominal (+) + obese,   Peds  Hematology   Anesthesia Other Findings   Reproductive/Obstetrics                            Anesthesia Physical Anesthesia Plan  ASA: III  Anesthesia Plan: General   Post-op Pain Management:  Regional for Post-op pain   Induction:   PONV Risk Score and Plan: Ondansetron, Dexamethasone and Scopolamine patch - Pre-op  Airway Management Planned: LMA  Additional Equipment:   Intra-op Plan:   Post-operative Plan: Extubation in OR  Informed Consent: I have reviewed the patients History and Physical, chart, labs and discussed the procedure including the risks, benefits and alternatives for the proposed anesthesia with the patient or authorized representative who has indicated his/her understanding and acceptance.     Dental advisory given  Plan Discussed with: CRNA  Anesthesia Plan Comments:        Anesthesia Quick Evaluation

## 2018-04-28 NOTE — Anesthesia Postprocedure Evaluation (Signed)
Anesthesia Post Note  Patient: Teresa Crane  Procedure(s) Performed: OPEN REDUCTION INTERNAL FIXATION (ORIF) RIGHT ANKLE FRACTURE (Right Ankle)     Patient location during evaluation: PACU Anesthesia Type: General Level of consciousness: sedated Pain management: pain level controlled Vital Signs Assessment: post-procedure vital signs reviewed and stable Respiratory status: spontaneous breathing Cardiovascular status: stable Postop Assessment: no apparent nausea or vomiting Anesthetic complications: no    Last Vitals:  Vitals:   04/28/18 1505 04/28/18 1515  BP: (!) 177/113 (!) 160/79  Pulse: 98 95  Resp: 20 (!) 21  Temp:    SpO2: 90% 92%    Last Pain:  Vitals:   04/28/18 1515  TempSrc:   PainSc: 10-Worst pain ever   Pain Goal:        RLE Motor Response: Purposeful movement (04/28/18 1515) RLE Sensation: Decreased (04/28/18 1515)        Caren Macadam

## 2018-04-28 NOTE — Op Note (Signed)
04/28/2018  PATIENT:  Teresa Crane    PRE-OPERATIVE DIAGNOSIS: Right bimalleolar ankle fracture dislocation with syndesmotic disruption  POST-OPERATIVE DIAGNOSIS:  Same  PROCEDURE:    1.  Open Reduction Internal Fixation right bimalleolar ankle fracture, with fixation of the fibula, without fixation of the posterior lip. 2.  Open reduction internal fixation right syndesmosis 3.  3 views plus a stress view of the right ankle  SURGEON:  Eulas Post, MD  PHYSICIAN ASSISTANT: Janace Litten, OPA-C, present and scrubbed throughout the case, critical for completion in a timely fashion, and for retraction, instrumentation, and closure.  ANESTHESIA:   General with regional block  ESTIMATED BLOOD LOSS: Minimal  UNIQUE ASPECTS OF THE CASE: The distal fibula had extreme comminution, with a separate posterior segment which I did lag independently, and although on radiographs it did not appear like it was a solid hold, the piece was basically bone graft, and was adequately positioned.  I used it to measure my length.  The anterior portion of the distal fibula was also comminuted and displaced into the soft tissue, I was able to reduce it ultimately after the plate was in place, and reconstitute the bone.  There was also some deep bone loss with combination which I had to extricate and remove in order to achieve reduction.  I did place demineralized bone graft into the void of the fibula given the significant comminution and bone destruction and the small pieces that I had to remove.  PREOPERATIVE INDICATIONS:  Teresa Crane is a  38 y.o. female who dislocated her right ankle and had significant comminution of the fibula.  She underwent emergent closed reduction performed in the emergency room, and then definitive management brought up to the operating room.  The risks benefits and alternatives were discussed with the patient preoperatively including but not limited to the risks of infection,  bleeding, nerve injury, cardiopulmonary complications, the need for revision surgery, the need for hardware removal, among others, and the patient was willing to proceed.  OPERATIVE IMPLANTS: Biomet / Zimmer anatomic distal fibular locking plate with 4 distal locking screws, 3 proximal cortical screws, and 2  3.5 mm syndesmotic screws placed tricortically, with a single interfragmentary lag screw, 2.34mm.  OPERATIVE PROCEDURE: The patient was brought to the operating room and placed in the supine position. All bony prominences were padded. General anesthesia was administered. The lower extremity was prepped and draped in the usual sterile fashion. The leg was elevated and exsanguinated and the tourniquet was inflated. Time out was performed.   Incision was made over the distal fibula and the fracture was exposed, cleaned, I held the posterior butterfly segment with a clamp, assessed my length, extricated the bone fragments, and then reduced the posterior aspect of the fibula to the distal segment, held this provisionally with a clamp, there was just barely enough bone to achieve this.  I then applied the plate, secured it proximally with nonlocking screws, distally with locking screws.  I had excellent reduction.  I confirmed position on C arm.  Bone quality was reasonably good proximally.  I placed some bone graft of demineralized bone into the void at the gap of the distal fibula, and then placed the anterior segment of bone which remained attached to the soft tissues, which was basically the lateral segment, into the void.  I contoured it slightly with a rondure in order to achieve near anatomic reduction.  The syndesmosis was reduced manually, and then a total of 2  tricortical screws were placed with excellent fixation.  The wounds were irrigated, and closed with vicryl with routine closure for the skin. The wounds were injected with local anesthetic. Sterile gauze was applied followed by a posterior  splint. She was awakened and returned to the PACU in stable and satisfactory condition. There were no complications.  3-Views plus a stress view of the ankle demonstrated appropriate reconstruction of the mortise with internal fixation and appropriate stability of the syndesmosis.

## 2018-04-28 NOTE — ED Triage Notes (Signed)
Pt arrives to ED after tripping and landing on her foot wrong. Right ankle swollen, obvious deformity noted, right foot externally rotated.

## 2018-04-28 NOTE — ED Provider Notes (Signed)
.  Sedation Date/Time: 04/28/2018 11:47 AM Performed by: Sabas Sous, MD Authorized by: Sabas Sous, MD   Consent:    Consent obtained:  Verbal and written   Consent given by:  Patient   Risks discussed:  Inadequate sedation, respiratory compromise necessitating ventilatory assistance and intubation and allergic reaction Universal protocol:    Immediately prior to procedure a time out was called: yes     Patient identity confirmation method:  Arm band, verbally with patient, provided demographic data and hospital-assigned identification number Indications:    Procedure performed:  Dislocation reduction   Procedure necessitating sedation performed by:  Different physician Pre-sedation assessment:    Time since last food or drink:  3-4 hours   ASA classification: class 1 - normal, healthy patient     Mallampati score:  I - soft palate, uvula, fauces, pillars visible   Pre-sedation assessments completed and reviewed: airway patency, cardiovascular function, mental status and respiratory function   Immediate pre-procedure details:    Reviewed: vital signs     Verified: bag valve mask available, emergency equipment available, intubation equipment available, IV patency confirmed, oxygen available and suction available   Procedure details (see MAR for exact dosages):    Preoxygenation:  Nasal cannula   Sedation:  Ketamine and propofol   Analgesia:  Hydromorphone   Intra-procedure monitoring:  Blood pressure monitoring, continuous capnometry, continuous pulse oximetry and frequent vital sign checks   Intra-procedure events: respiratory depression     Intra-procedure management:  Airway repositioning, BVM ventilation and supplemental oxygen   Total Provider sedation time (minutes):  32 Post-procedure details:    Post-sedation assessments completed and reviewed: airway patency and mental status     Patient is stable for discharge or admission: yes     Patient tolerance:  Tolerated well,  no immediate complications Comments:     Patient was given 50 mg of ketamine and 50 mg of propofol.  Mild apnea and hypoxia managed with brief chin lift and bag-valve-mask.  Patient recovered well with no other complications.      Sabas Sous, MD 04/28/18 [phone]

## 2018-04-28 NOTE — Transfer of Care (Signed)
Immediate Anesthesia Transfer of Care Note  Patient: Teresa Crane  Procedure(s) Performed: OPEN REDUCTION INTERNAL FIXATION (ORIF) RIGHT ANKLE FRACTURE (Right Ankle)  Patient Location: PACU  Anesthesia Type:General  Level of Consciousness: awake, alert  and oriented  Airway & Oxygen Therapy: Patient Spontanous Breathing and Patient connected to face mask oxygen  Post-op Assessment: Report given to RN and Post -op Vital signs reviewed and stable  Post vital signs: Reviewed and stable  Last Vitals:  Vitals Value Taken Time  BP 162/129 04/28/2018  2:47 PM  Temp    Pulse 104 04/28/2018  2:49 PM  Resp 29 04/28/2018  2:49 PM  SpO2 94 % 04/28/2018  2:49 PM  Vitals shown include unvalidated device data.  Last Pain:  Vitals:   04/28/18 0847  TempSrc:   PainSc: 10-Worst pain ever         Complications: No apparent anesthesia complications

## 2018-04-28 NOTE — Anesthesia Procedure Notes (Signed)
Procedure Name: LMA Insertion Date/Time: 04/28/2018 12:21 PM Performed by: Mayer Camel, CRNA Pre-anesthesia Checklist: Patient identified, Emergency Drugs available, Suction available and Patient being monitored Patient Re-evaluated:Patient Re-evaluated prior to induction Oxygen Delivery Method: Circle System Utilized Preoxygenation: Pre-oxygenation with 100% oxygen Induction Type: IV induction Ventilation: Mask ventilation without difficulty LMA: LMA inserted LMA Size: 4.0 Number of attempts: 1 Airway Equipment and Method: Bite block Placement Confirmation: positive ETCO2 Tube secured with: Tape Dental Injury: Teeth and Oropharynx as per pre-operative assessment

## 2018-04-28 NOTE — Discharge Summary (Signed)
Physician Discharge Summary  Patient ID: Teresa Crane MRN: [id number] DOB/AGE: 39/11/1980 39 y.o.  Admit date: 04/28/2018 Discharge date: 04/28/2018  Admission Diagnoses:  Right ankle fracture dislocation    Discharge Diagnoses:  Active Problems:   Closed fracture of right distal fibula   Dislocation of right ankle joint   Ankle dislocation, right, initial encounter   Past Medical History:  Diagnosis Date  . Asthma   . Obesity     Surgeries: Procedure(s): OPEN REDUCTION INTERNAL FIXATION (ORIF) RIGHT ANKLE FRACTURE on 04/28/2018   Consultants (if any): Treatment Team:  Teryl LucyLandau, Dedric Ethington, MD  Discharged Condition: Improved  Hospital Course: Teresa Pastelamika N Kleinert is an 38 y.o. female who was admitted 04/28/2018 with a diagnosis of right ankle fracture dislocation and went to the operating room on 04/28/2018 and underwent the above named procedures.  She underwent close reduction in the emergency room and then definitive operative fixation later that day.  She was discharged from the PACU.  She was given perioperative antibiotics:  Anti-infectives (From admission, onward)   Start     Dose/Rate Route Frequency Ordered Stop   04/28/18 1115  clindamycin (CLEOCIN) IVPB 900 mg     900 mg 100 mL/hr over 30 Minutes Intravenous On call to O.R. 04/28/18 1101 04/28/18 1254   04/28/18 1109  clindamycin (CLEOCIN) 900 MG/50ML IVPB  Status:  Discontinued    Note to Pharmacy:  Lorenda IshiharaGibbs, Bonnie   : cabinet override      04/28/18 1109 04/28/18 1122    .  She was given sequential compression devices, early ambulation  for DVT prophylaxis.  She benefited maximally from the hospital stay and there were no complications.    Recent vital signs:  Vitals:   04/28/18 1545 04/28/18 1550  BP:  (!) 170/91  Pulse: 93 90  Resp:  18  Temp:    SpO2: 94% 95%    Recent laboratory studies:  Lab Results  Component Value Date   HGB 9.9 DELTA CHECK NOTED (L) 09/13/2006   HGB 12.1 09/11/2006   Lab  Results  Component Value Date   WBC 15.0 (H) 09/13/2006   PLT 180 DELTA CHECK NOTED 09/13/2006   No results found for: INR No results found for: NA, K, CL, CO2, BUN, CREATININE, GLUCOSE  Discharge Medications:   Allergies as of 04/28/2018      Reactions   Penicillins    Vaginal issue Has patient had a PCN reaction causing immediate rash, facial/tongue/throat swelling, SOB or lightheadedness with hypotension: No Has patient had a PCN reaction causing severe rash involving mucus membranes or skin necrosis: No Has patient had a PCN reaction that required hospitalization: No Has patient had a PCN reaction occurring within the last 10 years: No If all of the above answers are "NO", then may proceed with Cephalosporin use.      Medication List    STOP taking these medications   cyclobenzaprine 5 MG tablet Commonly known as:  FLEXERIL   diclofenac 50 MG tablet Commonly known as:  CATAFLAM   ibuprofen 200 MG tablet Commonly known as:  ADVIL,MOTRIN   predniSONE 20 MG tablet Commonly known as:  DELTASONE     TAKE these medications   acetaminophen 500 MG tablet Commonly known as:  TYLENOL Take 1,000 mg by mouth daily as needed for headache (PAIN).   diclofenac sodium 1 % Gel Commonly known as:  VOLTAREN Apply 2 g topically 4 (four) times daily. Please instruct in dosing.   MUCINEX FAST-MAX COLD FLU  PO Take 20 mLs by mouth every 4 (four) hours as needed (COLD/FLU).   ondansetron 4 MG tablet Commonly known as:  ZOFRAN Take 1 tablet (4 mg total) by mouth every 8 (eight) hours as needed for nausea or vomiting.   ONE-A-DAY WOMENS PO Take 1 tablet by mouth daily.   oxyCODONE 5 MG immediate release tablet Commonly known as:  ROXICODONE Take 1 tablet (5 mg total) by mouth every 4 (four) hours as needed for severe pain.   PROAIR HFA 108 (90 Base) MCG/ACT inhaler Generic drug:  albuterol Inhale 2 puffs into the lungs daily as needed for wheezing or shortness of breath.    albuterol 108 (90 Base) MCG/ACT inhaler Commonly known as:  PROVENTIL HFA;VENTOLIN HFA Inhale 1-2 puffs into the lungs every 6 (six) hours as needed for wheezing or shortness of breath.   pseudoephedrine 30 MG tablet Commonly known as:  SUDAFED Take 30 mg by mouth every 4 (four) hours as needed for congestion.   pseudoephedrine 120 MG 12 hr tablet Commonly known as:  SUDAFED Take 120 mg by mouth 2 (two) times daily.   sennosides-docusate sodium 8.6-50 MG tablet Commonly known as:  SENOKOT-S Take 2 tablets by mouth daily.       Diagnostic Studies: Dg Ankle 2 Views Right  Result Date: 04/28/2018 CLINICAL DATA:  Fall with foot deformity EXAM: RIGHT ANKLE - 2 VIEW COMPARISON:  None. FINDINGS: Ankle fracture dislocation with lateral displacement of the foot. There is a comminuted distal fibular fracture with displacement, fracture seen at and above the ankle joint. A tibia fracture is not seen. IMPRESSION: Distal fibula fracture with lateral ankle dislocation. Electronically Signed   By: Marnee Spring M.D.   On: 04/28/2018 09:29   Ct Ankle Right Wo Contrast  Result Date: 04/28/2018 CLINICAL DATA:  Larey Seat today. Evaluate right ankle fracture dislocation. EXAM: CT OF THE RIGHT ANKLE WITHOUT CONTRAST TECHNIQUE: Multidetector CT imaging of the right ankle was performed according to the standard protocol. Multiplanar CT image reconstructions were also generated. COMPARISON:  Radiographs 04/28/2018 FINDINGS: Complex comminuted displaced spiral type fracture of the distal fibula at and above the level of the ankle mortise. The largest fragment is located anteriorly and measures 2.6 cm in length. The distal fibula relationship with the take this is maintained. No definite loose fracture fragments in the tibiotalar joint space. There is a small avulsion type fracture involving the posterior malleolus of the tibia but the medial malleolus is intact. There is moderate medial mortise widening consistent  with a deltoid ligament injury. The talus is intact. The mid and hindfoot joint spaces are maintained. IMPRESSION: Complex comminuted distal fibular fracture as detailed above. Please see 3D images. Small avulsion type fracture involving the posterior malleolus of the tibia. Widened medial mortise consistent with deltoid ligament rupture. Electronically Signed   By: Rudie Meyer M.D.   On: 04/28/2018 11:29   Dg Ankle Right Port  Result Date: 04/28/2018 CLINICAL DATA:  Post reduction. EXAM: PORTABLE RIGHT ANKLE - 2 VIEW COMPARISON:  04/28/2018 FINDINGS: Radiographs of the right ankle post reduction of distal fibular fracture and lateral ankle dislocation demonstrate improved alignment at the right ankle with continued widening of the ankle mortise. The fracture fragments of the comminuted fibular fracture are better aligned as well. IMPRESSION: Improved alignment at the right ankle post reduction. Continued disruption of the ankle mortise. Electronically Signed   By: Ted Mcalpine M.D.   On: 04/28/2018 10:28    Disposition:  Follow-up Information    Teryl Lucy, MD. Schedule an appointment as soon as possible for a visit in 1 week.   Specialty:  Orthopedic Surgery Contact information: [address] [place] [address] [phone]            Signed: Eulas Post 04/28/2018, 10:36 PM

## 2018-04-28 NOTE — ED Notes (Signed)
Consent obtained from patient, mother called and notified of patient's status per pt request.

## 2018-04-28 NOTE — Progress Notes (Signed)
Orthopedic Tech Progress Note Patient Details:  Teresa Crane 31-Dec-1980 [id number] Was called by RN letting me know DR and PA was coming to do a reduction and I needed to apply a splint to patient. Ortho Devices Type of Ortho Device: Short leg splint, Stirrup splint Ortho Device/Splint Location: LRE Ortho Device/Splint Interventions: Adjustment, Application, Ordered   Post Interventions Patient Tolerated: Well Instructions Provided: Care of device, Adjustment of device   Donald Pore 04/28/2018, 10:10 AM

## 2018-04-28 NOTE — Anesthesia Procedure Notes (Signed)
Anesthesia Regional Block: Popliteal block   Pre-Anesthetic Checklist: ,, timeout performed, Correct Patient, Correct Site, Correct Laterality, Correct Procedure, Correct Position, site marked, Risks and benefits discussed,  Surgical consent,  Pre-op evaluation,  At surgeon's request and post-op pain management  Laterality: Lower and Right  Prep: chloraprep       Needles:  Injection technique: Single-shot  Needle Type: Echogenic Stimulator Needle     Needle Length: 10cm  Needle Gauge: 21   Needle insertion depth: 2 cm   Additional Needles:   Procedures:,,,, ultrasound used (permanent image in chart),,,,  Narrative:  Start time: 04/28/2018 11:30 AM End time: 04/28/2018 11:40 AM Injection made incrementally with aspirations every 5 mL.  Performed by: Personally  Anesthesiologist: Leilani Able, MD

## 2018-04-28 NOTE — Progress Notes (Signed)
RT NOTE:  RT at standby during conscious sedation. Pt needed some BMV due to lack of respiratory effort. Pt is stable at this time on 2 L [place].

## 2018-04-28 NOTE — Sedation Documentation (Signed)
Ankle reduced by MD and ortho. Pt's saturations dropped into mid-80s. Oxygen increased via nasal cannula.

## 2018-04-28 NOTE — Consult Note (Addendum)
Reason for Consult:Right ankle fx Referring Physician: Elvera Crane is an 38 y.o. female.  HPI: Teresa Crane was doing a split on a wall when the ankle on the floor snapped and she fell. She had immediate pain and an ankle deformity. She was brought to the ED where x-rays showed an ankle fx/dislocation and orthopedic surgery was consulted. She c/o localized pain to the area. She is a stay at home mom.  Past Medical History:  Diagnosis Date  . Asthma   . Obesity     Past Surgical History:  Procedure Laterality Date  . CYST EXCISION    . KNEE SURGERY Left     History reviewed. No pertinent family history.  Social History:  reports that she has been smoking. She does not have any smokeless tobacco history on file. She reports current alcohol use. She reports that she does not use drugs.  Allergies:  Allergies  Allergen Reactions  . Penicillins     Vaginal issue Has patient had a PCN reaction causing immediate rash, facial/tongue/throat swelling, SOB or lightheadedness with hypotension: No Has patient had a PCN reaction causing severe rash involving mucus membranes or skin necrosis: No Has patient had a PCN reaction that required hospitalization: No Has patient had a PCN reaction occurring within the last 10 years: No If all of the above answers are "NO", then may proceed with Cephalosporin use.     Medications: I have reviewed the patient's current medications.  Results for orders placed or performed during the hospital encounter of 04/28/18 (from the past 48 hour(s))  I-Stat Beta hCG blood, ED ([facility], [facility], [facility] only)     Status: None   Collection Time: 04/28/18  9:31 AM  Result Value Ref Range   I-stat hCG, quantitative <5.0 <5 mIU/mL   Comment 3            Comment:   GEST. AGE      CONC.  (mIU/mL)   <=1 WEEK        5 - 50     2 WEEKS       50 - 500     3 WEEKS       100 - 10,000     4 WEEKS     1,000 - 30,000        FEMALE AND NON-PREGNANT FEMALE:     LESS THAN 5  mIU/mL     Dg Ankle 2 Views Right  Result Date: 04/28/2018 CLINICAL DATA:  Fall with foot deformity EXAM: RIGHT ANKLE - 2 VIEW COMPARISON:  None. FINDINGS: Ankle fracture dislocation with lateral displacement of the foot. There is a comminuted distal fibular fracture with displacement, fracture seen at and above the ankle joint. A tibia fracture is not seen. IMPRESSION: Distal fibula fracture with lateral ankle dislocation. Electronically Signed   By: Marnee Spring M.D.   On: 04/28/2018 09:29   Dg Ankle Right Port  Result Date: 04/28/2018 CLINICAL DATA:  Post reduction. EXAM: PORTABLE RIGHT ANKLE - 2 VIEW COMPARISON:  04/28/2018 FINDINGS: Radiographs of the right ankle post reduction of distal fibular fracture and lateral ankle dislocation demonstrate improved alignment at the right ankle with continued widening of the ankle mortise. The fracture fragments of the comminuted fibular fracture are better aligned as well. IMPRESSION: Improved alignment at the right ankle post reduction. Continued disruption of the ankle mortise. Electronically Signed   By: Ted Mcalpine M.D.   On: 04/28/2018 10:28    Review of  Systems  Constitutional: Negative for weight loss.  HENT: Negative for ear discharge, ear pain, hearing loss and tinnitus.   Eyes: Negative for blurred vision, double vision, photophobia and pain.  Respiratory: Negative for cough, sputum production and shortness of breath.   Cardiovascular: Negative for chest pain.  Gastrointestinal: Negative for abdominal pain, nausea and vomiting.  Genitourinary: Negative for dysuria, flank pain, frequency and urgency.  Musculoskeletal: Positive for joint pain (Right ankle). Negative for back pain, falls, myalgias and neck pain.  Neurological: Negative for dizziness, tingling, sensory change, focal weakness, loss of consciousness and headaches.  Endo/Heme/Allergies: Does not bruise/bleed easily.  Psychiatric/Behavioral: Negative for depression,  memory loss and substance abuse. The patient is not nervous/anxious.    Blood pressure (!) 147/102, pulse (!) 104, temperature 97.6 F (36.4 C), temperature source Oral, resp. rate (!) 24, height 5\' 5"  (1.651 m), weight 127 kg, SpO2 96 %. Physical Exam  Constitutional: She appears well-developed and well-nourished. No distress.  HENT:  Head: Normocephalic and atraumatic.  Eyes: Conjunctivae are normal. Right eye exhibits no discharge. Left eye exhibits no discharge. No scleral icterus.  Neck: Normal range of motion.  Cardiovascular: Normal rate and regular rhythm.  Respiratory: Effort normal. No respiratory distress.  Musculoskeletal:     Comments: RLE Small superficial abrasion anterior ankle, no ecchymosis or rash  Severe TTP, deformity to ankle  No knee effusion  Knee stable to varus/ valgus and anterior/posterior stress  Sens DPN, SPN, TN intact  Motor EHL, ext, flex, evers 5/5  DP 2+, No significant pedal edema  Neurological: She is alert.  Skin: Skin is warm and dry. She is not diaphoretic.  Psychiatric: She has a normal mood and affect. Her behavior is normal.    Assessment/Plan: Right ankle fx -- Partial reduction by EDP. Will CT and then take to OR for ORIF. Continue NPO.    Freeman Caldron, PA-C Orthopedic Surgery [phone] 04/28/2018, 10:50 AM   Seen and agree with above.   The risks benefits and alternatives were discussed with the patient including but not limited to the risks of nonoperative treatment, versus surgical intervention including infection, bleeding, nerve injury, malunion, nonunion, the need for revision surgery, hardware prominence, hardware failure, the need for hardware removal, blood clots, cardiopulmonary complications, morbidity, mortality, among others, and they were willing to proceed.    Eulas Post, MD

## 2018-04-28 NOTE — ED Notes (Signed)
Pt's family arrived and is being brought to short stay to sign consent for the pt's procedure.

## 2018-04-28 NOTE — ED Provider Notes (Signed)
[facility] Provider Note   CSN: [id number] Arrival date & time: 04/28/18  [id number]    History   Chief Complaint Chief Complaint  Patient presents with  . Ankle Pain    HPI Teresa Crane is a 38 y.o. female.     The history is provided by the patient. No language interpreter was used.  Ankle Pain  Location:  Ankle Time since incident:  1 hour Injury: yes   Ankle location:  R ankle Pain details:    Quality:  Sharp   Severity:  Severe   Onset quality:  Sudden   Progression:  Worsening Chronicity:  New Dislocation: yes   Foreign body present:  No foreign bodies Tetanus status:  Up to date Relieved by:  Nothing Worsened by:  Nothing Ineffective treatments:  None tried Associated symptoms: no back pain   Risk factors: no concern for non-accidental trauma   Pt reports she tripped and fell injuring her ankle.  Pt complains of swelling and pain  Past Medical History:  Diagnosis Date  . Asthma   . Obesity     There are no active problems to display for this patient.   Past Surgical History:  Procedure Laterality Date  . CYST EXCISION    . KNEE SURGERY Left      OB History   No obstetric history on file.      Home Medications    Prior to Admission medications   Medication Sig Start Date End Date Taking? Authorizing Provider  acetaminophen (TYLENOL) 500 MG tablet Take 1,000 mg by mouth daily as needed for headache (PAIN).    [provider]  albuterol (PROAIR HFA) 108 (90 Base) MCG/ACT inhaler Inhale 2 puffs into the lungs daily as needed for wheezing or shortness of breath.    [provider]  albuterol (PROVENTIL HFA;VENTOLIN HFA) 108 (90 Base) MCG/ACT inhaler Inhale 1-2 puffs into the lungs every 6 (six) hours as needed for wheezing or shortness of breath. 02/26/17   Street, LeveringMercedes, PA-C  cyclobenzaprine (FLEXERIL) 5 MG tablet Take 1 tablet (5 mg total) by mouth 3 (three) times daily as needed for  muscle spasms. Patient not taking: Reported on 02/26/2017 04/04/15   Hayden RasmussenMabe, David, NP  diclofenac (CATAFLAM) 50 MG tablet Take 1 tablet (50 mg total) by mouth 3 (three) times daily. One tablet TID with food prn pain. Patient not taking: Reported on 02/26/2017 04/04/15   Hayden RasmussenMabe, David, NP  diclofenac sodium (VOLTAREN) 1 % GEL Apply 2 g topically 4 (four) times daily. Please instruct in dosing. Patient not taking: Reported on 02/26/2017 04/29/15   Linna HoffKindl, James D, MD  ibuprofen (ADVIL,MOTRIN) 200 MG tablet Take 400 mg by mouth daily as needed for headache or cramping.    [provider]  Multiple Vitamins-Calcium (ONE-A-DAY WOMENS PO) Take 1 tablet by mouth daily.    [provider]  Phenylephrine-DM-GG-APAP (MUCINEX FAST-MAX COLD FLU PO) Take 20 mLs by mouth every 4 (four) hours as needed (COLD/FLU).    [provider]  predniSONE (DELTASONE) 20 MG tablet 3 tabs po daily x 4 days starting tomorrow 02/27/17 02/26/17   Street, PearlingtonMercedes, PA-C  pseudoephedrine (SUDAFED) 120 MG 12 hr tablet Take 120 mg by mouth 2 (two) times daily.    [provider]  pseudoephedrine (SUDAFED) 30 MG tablet Take 30 mg by mouth every 4 (four) hours as needed for congestion.    [provider]    Family History No family history  on file.  Social History Social History   Tobacco Use  . Smoking status: Current Every Day Smoker  Substance Use Topics  . Alcohol use: Yes    Comment: social  . Drug use: No     Allergies   Penicillins   Review of Systems Review of Systems  Musculoskeletal: Positive for arthralgias and joint swelling. Negative for back pain.  Skin:       Abrasion ankle  All other systems reviewed and are negative.    Physical Exam Updated Vital Signs BP (!) 147/102   Pulse (!) 104   Temp 97.6 F (36.4 C) (Oral)   Resp (!) 31   Ht 5\' 5"  (1.651 m)   Wt 127 kg   SpO2 99%   BMI 46.59 kg/m   Physical Exam Vitals signs and nursing note reviewed.    Constitutional:      Appearance: She is well-developed.  HENT:     Head: Normocephalic.  Neck:     Musculoskeletal: Normal range of motion.  Cardiovascular:     Rate and Rhythm: Normal rate.  Pulmonary:     Effort: Pulmonary effort is normal.  Abdominal:     General: Abdomen is flat. There is no distension.  Musculoskeletal: Normal range of motion.        General: Swelling present.  Skin:    General: Skin is warm.  Neurological:     Mental Status: She is alert and oriented to person, place, and time.  Psychiatric:        Mood and Affect: Mood normal.      ED Treatments / Results  Labs (all labs ordered are listed, but only abnormal results are displayed) Labs Reviewed  I-STAT BETA HCG BLOOD, ED ([facility], [facility], [facility] ONLY)    EKG None  Radiology Dg Ankle 2 Views Right  Result Date: 04/28/2018 CLINICAL DATA:  Fall with foot deformity EXAM: RIGHT ANKLE - 2 VIEW COMPARISON:  None. FINDINGS: Ankle fracture dislocation with lateral displacement of the foot. There is a comminuted distal fibular fracture with displacement, fracture seen at and above the ankle joint. A tibia fracture is not seen. IMPRESSION: Distal fibula fracture with lateral ankle dislocation. Electronically Signed   By: Marnee Spring M.D.   On: 04/28/2018 09:29    Procedures Reduction of dislocation Date/Time: 04/28/2018 10:16 AM Performed by: Elson Areas, PA-C Authorized by: Elson Areas, PA-C  Consent: Verbal consent obtained. Risks and benefits: risks, benefits and alternatives were discussed Consent given by: patient Patient understanding: patient states understanding of the procedure being performed Patient consent: the patient's understanding of the procedure matches consent given Procedure consent: procedure consent matches procedure scheduled Test results: test results available and properly labeled Imaging studies: imaging studies available Required items: required blood products, implants,  devices, and special equipment available Patient identity confirmed: verbally with patient Time out: Immediately prior to procedure a "time out" was called to verify the correct patient, procedure, equipment, support staff and site/side marked as required. Local anesthesia used: no  Anesthesia: Local anesthesia used: no  Sedation: Patient sedated: yes Sedatives: ketamine and etomidate Vitals: Vital signs were monitored during sedation.  Patient tolerance: Patient tolerated the procedure well with no immediate complications Comments: Fracture reduced with traction and pressure,  Appears to have good alignment     (including critical care time)  Medications Ordered in ED Medications  HYDROmorphone (DILAUDID) injection 1 mg (1 mg Intravenous Given 04/28/18 0903)  ondansetron (ZOFRAN) injection 4 mg (4 mg  Intravenous Given 04/28/18 0903)  ketamine 50 mg in normal saline 5 mL (10 mg/mL) syringe (50 mg Intravenous Given 04/28/18 0954)  propofol (DIPRIVAN) 10 mg/mL bolus/IV push 127 mg (50 mg Intravenous Given 04/28/18 0958)     Initial Impression / Assessment and Plan / ED Course  I have reviewed the triage vital signs and the nursing notes.  Pertinent labs & imaging results that were available during my care of the patient were reviewed by me and considered in my medical decision making (see chart for details).        MIchael Jeffries and Dorthula Nettles here to see pt.   Ct ordered   Final Clinical Impressions(s) / ED Diagnoses   Final diagnoses:  Other closed fracture of distal end of right fibula, initial encounter  Dislocation of right ankle joint, initial encounter    ED Discharge Orders    None       Elson Areas, [place] 04/28/18 1031    Sabas Sous, MD 04/28/18 1152

## 2018-04-28 NOTE — Discharge Instructions (Signed)
Diet: As you were doing prior to hospitalization   Shower:  May shower but keep the wounds dry, use an occlusive plastic wrap, NO SOAKING IN TUB.  If the bandage gets wet, change with a clean dry gauze.  If you have a splint on, leave the splint in place and keep the splint dry with a plastic bag.  Dressing:  You may change your dressing 3-5 days after surgery, unless you have a splint.  If you have a splint, then just leave the splint in place and we will change your bandages during your first follow-up appointment.    If you had hand or foot surgery, we will plan to remove your stitches in about 2 weeks in the office.  For all other surgeries, there are sticky tapes (steri-strips) on your wounds and all the stitches are absorbable.  Leave the steri-strips in place when changing your dressings, they will peel off with time, usually 2-3 weeks.  Activity:  Increase activity slowly as tolerated, but follow the weight bearing instructions below.  The rules on driving is that you can not be taking narcotics while you drive, and you must feel in control of the vehicle.    Weight Bearing:   Nonweightbearing, right lower extremity.    To prevent constipation: you may use a stool softener such as -  Colace (over the counter) 100 mg by mouth twice a day  Drink plenty of fluids (prune juice may be helpful) and high fiber foods Miralax (over the counter) for constipation as needed.    Itching:  If you experience itching with your medications, try taking only a single pain pill, or even half a pain pill at a time.  You may take up to 10 pain pills per day, and you can also use benadryl over the counter for itching or also to help with sleep.   Precautions:  If you experience chest pain or shortness of breath - call 911 immediately for transfer to the hospital emergency department!!  If you develop a fever greater that 101 F, purulent drainage from wound, increased redness or drainage from wound, or calf  pain -- Call the office at [phone]                                                Follow- Up Appointment:  Please call for an appointment to be seen in 2 weeks [place] - [phone]

## 2018-04-29 ENCOUNTER — Encounter (HOSPITAL_COMMUNITY): Payer: Self-pay | Admitting: Orthopedic Surgery

## 2020-08-20 ENCOUNTER — Other Ambulatory Visit: Payer: Self-pay

## 2020-08-20 ENCOUNTER — Encounter: Payer: Self-pay | Admitting: Family Medicine

## 2020-08-20 ENCOUNTER — Other Ambulatory Visit: Payer: Self-pay | Admitting: Family Medicine

## 2020-08-20 ENCOUNTER — Ambulatory Visit (INDEPENDENT_AMBULATORY_CARE_PROVIDER_SITE_OTHER): Payer: Medicaid Other | Admitting: Family Medicine

## 2020-08-20 VITALS — BP 143/108 | HR 110 | Wt 321.2 lb

## 2020-08-20 DIAGNOSIS — Z0000 Encounter for general adult medical examination without abnormal findings: Secondary | ICD-10-CM | POA: Diagnosis present

## 2020-08-20 DIAGNOSIS — Z862 Personal history of diseases of the blood and blood-forming organs and certain disorders involving the immune mechanism: Secondary | ICD-10-CM | POA: Diagnosis not present

## 2020-08-20 DIAGNOSIS — Z114 Encounter for screening for human immunodeficiency virus [HIV]: Secondary | ICD-10-CM

## 2020-08-20 DIAGNOSIS — Z6841 Body Mass Index (BMI) 40.0 and over, adult: Secondary | ICD-10-CM | POA: Diagnosis not present

## 2020-08-20 DIAGNOSIS — M5412 Radiculopathy, cervical region: Secondary | ICD-10-CM | POA: Diagnosis not present

## 2020-08-20 DIAGNOSIS — M792 Neuralgia and neuritis, unspecified: Secondary | ICD-10-CM | POA: Diagnosis not present

## 2020-08-20 DIAGNOSIS — Z1159 Encounter for screening for other viral diseases: Secondary | ICD-10-CM | POA: Diagnosis not present

## 2020-08-20 DIAGNOSIS — G8929 Other chronic pain: Secondary | ICD-10-CM | POA: Diagnosis not present

## 2020-08-20 DIAGNOSIS — M545 Low back pain, unspecified: Secondary | ICD-10-CM

## 2020-08-20 MED ORDER — GABAPENTIN 100 MG PO CAPS: 100.0000 mg | ORAL_CAPSULE | Freq: Every day | ORAL | 3 refills | Status: DC

## 2020-08-20 MED ORDER — IBUPROFEN 600 MG PO TABS: 600.0000 mg | ORAL_TABLET | Freq: Three times a day (TID) | ORAL | 0 refills | Status: DC | PRN

## 2020-08-20 NOTE — Progress Notes (Signed)
    SUBJECTIVE:   CHIEF COMPLAINT / HPI: Weight gain, right upper extremity pain, lumbar back pain  Weight gain and health concerns Patient states that she had severe fracture of her right lower extremity.  During this healing period, she reports that she gained a significant amount of weight and she is concerned that it is affecting her health.  She is worried about her heart health.  She states that she would like to have blood work to check her cholesterol and to see if she has developed diabetes due to her weight gain.  Right upper extremity pain Patient reports that she has pain that radiates from the right side of her lower neck down to her fingers on the right upper extremity.  She reports that she sometimes will drop objects.  She reports a sensation of electric-like pain traveling down her arm.  She denies any swelling or changes to her skin.  Lumbar back pain Patient also reports lumbar back pain that has been present for over a year.  She denies any radicular symptoms.  She denies urinary retention.  She denies fecal or urinary incontinence.  She denies saddle anesthesia.  She denies weakness in her lower extremities.  Pain crosses to both sides of her back.  She has tried heating pad and over-the-counter pain medications without much relief.  Hx of IDA Patient reports that she has been told she has low iron but does not take iron due to   Near end of visit after reviewing AVS, patient mentions "terrible periods" that are heavy and painful.   PERTINENT  PMH / PSH:  Hypertension Weight gain Right lower extremity fracture Obesity Stress Dysmenorrhea, Menorrhagia   OBJECTIVE:   BP (!) 143/108   Pulse (!) 110   Wt (!) 321 lb 3.2 oz (145.7 kg)   SpO2 96%   BMI 53.45 kg/m   General: female appearing stated age in no acute distress HEENT: MMM, no oral lesions noted,Neck non-tender without lymphadenopathy, masses or thyromegaly Cardio: Normal S1 and S2, no S3 or S4. Rhythm is  regular. No murmurs or rubs.  Bilateral radial pulses palpable Pulm: Clear to auscultation bilaterally, no crackles, wheezing, or diminished breath sounds. Normal respiratory effort, stable on room air Abdomen: Bowel sounds normal. Abdomen soft and non-tender.  Extremities: Trace lower extremity peripheral edema. Warm/ well perfused.  Neuro: pt alert and oriented x4  Back: No gross deformity, scoliosis. TTP in bilateral paraspinal muscles.  No midline or bony TTP. FROM. Sensation intact to light touch bilaterally.    ASSESSMENT/PLAN:   Healthcare maintenance Hep C screening  HIV screening    History of iron deficiency anemia Check ferritin   BMI 40.0-44.9, adult (HCC) Hgb A1c  TSH  Lipid panel   Lumbar back pain Patient reports lumbar back pain that appears to be chronic in nature. No signs of sciatica or radiculopathy on exam, patient ambulates independently.  -Lumbar xray -ibuprofen 600mg  PRN    Radicular pain of right upper extremity Radicular pain described, appears to be chronic in nature based on hx.  - cervical neck xray  -trial of gabapentin 100mg  qHS  - follow up in 1 month    Will need to discuss elevated BP at follow up visit.  , MD [facility]

## 2020-08-20 NOTE — Patient Instructions (Addendum)
It was a pleasure to see you today!  Thank you for choosing [facility] for your primary care.   Teresa Crane was seen for establishing care.   Our plans for today were: For your shoulder and back pain, I recommend having xrays of your neck and back at [address]  I will also prescribe a medication to help with what I believe is nerve pain.   To keep you healthy, please keep in mind the following health maintenance items that you are due for:    Pap smear     You should return to our clinic in 2weeks for menstruation follow up. See me in 1 month for your other follow up of back pain and blood pressure.   Best Wishes,   Dr. Neita Garnet

## 2020-08-21 LAB — COMPREHENSIVE METABOLIC PANEL
ALT: 16 IU/L (ref 0–32)
AST: 18 IU/L (ref 0–40)
Albumin/Globulin Ratio: 1.4 (ref 1.2–2.2)
Albumin: 4.1 g/dL (ref 3.8–4.8)
Alkaline Phosphatase: 101 IU/L (ref 44–121)
BUN/Creatinine Ratio: 7 — ABNORMAL LOW (ref 9–23)
BUN: 9 mg/dL (ref 6–24)
Bilirubin Total: <0.2 mg/dL (ref 0.0–1.2)
CO2: 24 mmol/L (ref 20–29)
Calcium: 9.2 mg/dL (ref 8.7–10.2)
Chloride: 100 mmol/L (ref 96–106)
Creatinine, Ser: 1.21 mg/dL — ABNORMAL HIGH (ref 0.57–1.00)
Globulin, Total: 3 g/dL (ref 1.5–4.5)
Glucose: 104 mg/dL — ABNORMAL HIGH (ref 65–99)
Potassium: 3.6 mmol/L (ref 3.5–5.2)
Sodium: 139 mmol/L (ref 134–144)
Total Protein: 7.1 g/dL (ref 6.0–8.5)
eGFR: 58 mL/min/{1.73_m2} — ABNORMAL LOW (ref >59)

## 2020-08-21 LAB — LIPID PANEL
Chol/HDL Ratio: 3.3 ratio (ref 0.0–4.4)
Cholesterol, Total: 184 mg/dL (ref 100–199)
HDL: 56 mg/dL (ref >39)
LDL Chol Calc (NIH): 112 mg/dL — ABNORMAL HIGH (ref 0–99)
Triglycerides: 85 mg/dL (ref 0–149)
VLDL Cholesterol Cal: 16 mg/dL (ref 5–40)

## 2020-08-21 LAB — HEPATITIS C ANTIBODY: Hep C Virus Ab: <0.1 s/co ratio (ref 0.0–0.9)

## 2020-08-21 LAB — TSH: TSH: 2.12 u[IU]/mL (ref 0.450–4.500)

## 2020-08-21 LAB — HEMOGLOBIN A1C
Est. average glucose Bld gHb Est-mCnc: 123 mg/dL
Hgb A1c MFr Bld: 5.9 % — ABNORMAL HIGH (ref 4.8–5.6)

## 2020-08-21 LAB — HIV ANTIBODY (ROUTINE TESTING W REFLEX): HIV Screen 4th Generation wRfx: NONREACTIVE

## 2020-08-23 DIAGNOSIS — M792 Neuralgia and neuritis, unspecified: Secondary | ICD-10-CM | POA: Insufficient documentation

## 2020-08-23 DIAGNOSIS — Z6841 Body Mass Index (BMI) 40.0 and over, adult: Secondary | ICD-10-CM | POA: Insufficient documentation

## 2020-08-23 DIAGNOSIS — Z862 Personal history of diseases of the blood and blood-forming organs and certain disorders involving the immune mechanism: Secondary | ICD-10-CM | POA: Insufficient documentation

## 2020-08-23 DIAGNOSIS — Z0000 Encounter for general adult medical examination without abnormal findings: Secondary | ICD-10-CM | POA: Insufficient documentation

## 2020-08-23 DIAGNOSIS — M545 Low back pain, unspecified: Secondary | ICD-10-CM | POA: Insufficient documentation

## 2020-08-23 NOTE — Assessment & Plan Note (Signed)
Check ferritin 

## 2020-08-23 NOTE — Assessment & Plan Note (Signed)
Hgb A1c  TSH  Lipid panel

## 2020-08-23 NOTE — Assessment & Plan Note (Signed)
Radicular pain described, appears to be chronic in nature based on hx.  - cervical neck xray  -trial of gabapentin 100mg  qHS  - follow up in 1 month

## 2020-08-23 NOTE — Assessment & Plan Note (Signed)
Patient reports lumbar back pain that appears to be chronic in nature. No signs of sciatica or radiculopathy on exam, patient ambulates independently.  -Lumbar xray -ibuprofen 600mg  PRN

## 2020-08-23 NOTE — Assessment & Plan Note (Signed)
Hep C screening  HIV screening

## 2020-09-03 ENCOUNTER — Ambulatory Visit
Admission: RE | Admit: 2020-09-03 | Discharge: 2020-09-03 | Disposition: A | Discharge status: Home / Self Care | Payer: Medicaid Other | Source: Ambulatory Visit | Attending: Family Medicine | Admitting: Family Medicine

## 2020-09-03 DIAGNOSIS — G8929 Other chronic pain: Secondary | ICD-10-CM

## 2020-09-03 DIAGNOSIS — M5412 Radiculopathy, cervical region: Secondary | ICD-10-CM

## 2020-09-03 NOTE — Progress Notes (Deleted)
    SUBJECTIVE:   CHIEF COMPLAINT / HPI: "heavy painful periods"   Teresa Crane is a 40 year old female presenting to discuss abnormal uterine bleeding.  PERTINENT  PMH / PSH: Elevated BMI, history of iron deficiency anemia, low back pain  OBJECTIVE:   There were no vitals taken for this visit.  ***  ASSESSMENT/PLAN:   No problem-specific Assessment & Plan notes found for this encounter.     [name], DO [facility]   {    This will disappear when note is signed, click to select method of visit    :1}

## 2020-09-04 ENCOUNTER — Ambulatory Visit: Payer: Medicaid Other | Admitting: Family Medicine

## 2020-09-07 ENCOUNTER — Ambulatory Visit (HOSPITAL_COMMUNITY)
Admission: EM | Admit: 2020-09-07 | Discharge: 2020-09-07 | Disposition: A | Discharge status: Home / Self Care | Payer: Medicaid Other | Attending: Family Medicine | Admitting: Family Medicine

## 2020-09-07 ENCOUNTER — Other Ambulatory Visit: Payer: Self-pay

## 2020-09-07 ENCOUNTER — Encounter (HOSPITAL_COMMUNITY): Payer: Self-pay | Admitting: *Deleted

## 2020-09-07 DIAGNOSIS — R030 Elevated blood-pressure reading, without diagnosis of hypertension: Secondary | ICD-10-CM | POA: Diagnosis not present

## 2020-09-07 DIAGNOSIS — K047 Periapical abscess without sinus: Secondary | ICD-10-CM

## 2020-09-07 MED ORDER — LIDOCAINE VISCOUS HCL 2 % MT SOLN: 10.0000 mL | OROMUCOSAL | 0 refills | Status: DC | PRN

## 2020-09-07 MED ORDER — CLINDAMYCIN HCL 300 MG PO CAPS: 300.0000 mg | ORAL_CAPSULE | Freq: Two times a day (BID) | ORAL | 0 refills | Status: DC

## 2020-09-07 NOTE — ED Triage Notes (Signed)
Pt reports dental pain on RT upper side and facial swelling.

## 2020-09-07 NOTE — ED Provider Notes (Signed)
[facility]    CSN: [id number] Arrival date & time: 09/07/20  1333      History   Chief Complaint Chief Complaint  Patient presents with   Dental Problem    HPI Teresa Crane is a 40 y.o. female.   Patient presenting today with 2 to 3-day history of acutely worsening right-sided upper and lower dental pain, swelling.  Denies drainage, fever, chills, difficulty swallowing or breathing.  Known to have significant decay in molars in both these regions, just now got insurance and is working to get the dental care she desperately needs.  So far taking Vicodin with no relief.  States she has not slept in 36 hours due to the pain.   Past Medical History:  Diagnosis Date   Asthma    Elevated blood pressure reading    Obesity     Patient Active Problem List   Diagnosis Date Noted   Healthcare maintenance 08/23/2020   History of iron deficiency anemia 08/23/2020   BMI 40.0-44.9, adult (HCC) 08/23/2020   Lumbar back pain 08/23/2020   Radicular pain of right upper extremity 08/23/2020   Closed fracture of right distal fibula 04/28/2018   Dislocation of right ankle joint 04/28/2018   Ankle dislocation, right, initial encounter 04/28/2018    Past Surgical History:  Procedure Laterality Date   CYST EXCISION     KNEE SURGERY Left    ORIF ANKLE FRACTURE Right 04/28/2018   Procedure: OPEN REDUCTION INTERNAL FIXATION (ORIF) RIGHT ANKLE FRACTURE;  Surgeon: Teryl Lucy, MD;  Location: [facility] OR;  Service: Orthopedics;  Laterality: Right;    OB History   No obstetric history on file.      Home Medications    Prior to Admission medications   Medication Sig Start Date End Date Taking? Authorizing Provider  clindamycin (CLEOCIN) 300 MG capsule Take 1 capsule (300 mg total) by mouth 2 (two) times daily. 09/07/20  Yes Particia Nearing, PA-C  lidocaine (XYLOCAINE) 2 % solution Use as directed 10 mLs in the mouth or throat as needed for mouth pain. 09/07/20  Yes Particia Nearing, PA-C  acetaminophen (TYLENOL) 500 MG tablet Take 1,000 mg by mouth daily as needed for headache (PAIN).    [provider]  albuterol (PROAIR HFA) 108 (90 Base) MCG/ACT inhaler Inhale 2 puffs into the lungs daily as needed for wheezing or shortness of breath.    [provider]  albuterol (PROVENTIL HFA;VENTOLIN HFA) 108 (90 Base) MCG/ACT inhaler Inhale 1-2 puffs into the lungs every 6 (six) hours as needed for wheezing or shortness of breath. 02/26/17   Street, [place], PA-C  diclofenac sodium (VOLTAREN) 1 % GEL Apply 2 g topically 4 (four) times daily. Please instruct in dosing. Patient not taking: Reported on 02/26/2017 04/29/15   Linna Hoff, MD  gabapentin (NEURONTIN) 100 MG capsule Take 1 capsule (100 mg total) by mouth at bedtime. 08/20/20   Simmons-Robinson, Tawanna Cooler, MD  ibuprofen (ADVIL) 600 MG tablet Take 1 tablet (600 mg total) by mouth every 8 (eight) hours as needed for cramping. 08/20/20   Simmons-Robinson, Tawanna Cooler, MD  Multiple Vitamins-Calcium (ONE-A-DAY WOMENS PO) Take 1 tablet by mouth daily.    [provider]  ondansetron (ZOFRAN) 4 MG tablet Take 1 tablet (4 mg total) by mouth every 8 (eight) hours as needed for nausea or vomiting. 04/28/18   Teryl Lucy, MD  oxyCODONE (ROXICODONE) 5 MG immediate release tablet Take 1 tablet (5 mg total) by mouth every 4 (  four) hours as needed for severe pain. 04/28/18   Teryl Lucy, MD  Phenylephrine-DM-GG-APAP (MUCINEX FAST-MAX COLD FLU PO) Take 20 mLs by mouth every 4 (four) hours as needed (COLD/FLU).    [provider]  pseudoephedrine (SUDAFED) 120 MG 12 hr tablet Take 120 mg by mouth 2 (two) times daily.    [provider]  pseudoephedrine (SUDAFED) 30 MG tablet Take 30 mg by mouth every 4 (four) hours as needed for congestion.    [provider]  sennosides-docusate sodium (SENOKOT-S) 8.6-50 MG tablet Take 2 tablets by mouth daily. 04/28/18   Teryl Lucy, MD     Family History History reviewed. No pertinent family history.  Social History Social History   Tobacco Use   Smoking status: Former    Packs/day: 0.50    Years: 20.00    Pack years: 10.00    Types: Cigarettes    Quit date: 05/22/2018    Years since quitting: 2.2   Smokeless tobacco: Never  Substance Use Topics   Alcohol use: Yes    Comment: social   Drug use: No     Allergies   Penicillins   Review of Systems Review of Systems Per HPI  Physical Exam Triage Vital Signs ED Triage Vitals  Enc Vitals Group     BP 09/07/20 1444 (!) 175/104     Pulse Rate 09/07/20 1444 84     Resp 09/07/20 1444 20     Temp 09/07/20 1444 98.3 F (36.8 C)     Temp src --      SpO2 09/07/20 1444 96 %     Weight --      Height --      Head Circumference --      Peak Flow --      Pain Score 09/07/20 1440 10     Pain Loc --      Pain Edu? --      Excl. in GC? --    No data found.  Updated Vital Signs BP (!) 175/104   Pulse 84   Temp 98.3 F (36.8 C)   Resp 20   LMP 08/13/2020   SpO2 96%   Visual Acuity Right Eye Distance:   Left Eye Distance:   Bilateral Distance:    Right Eye Near:   Left Eye Near:    Bilateral Near:     Physical Exam Vitals and nursing note reviewed.  Constitutional:      Appearance: Normal appearance. She is not ill-appearing.  HENT:     Head: Atraumatic.     Mouth/Throat:     Mouth: Mucous membranes are moist.     Comments: Diffusely poor dentition, particularly right upper and lower molars.  Significant decay and darkened teeth in this area with gingival erythema surrounding.  No active drainage or abscess noted. Eyes:     Extraocular Movements: Extraocular movements intact.     Conjunctiva/sclera: Conjunctivae normal.  Cardiovascular:     Rate and Rhythm: Normal rate and regular rhythm.     Heart sounds: Normal heart sounds.  Pulmonary:     Effort: Pulmonary effort is normal. No respiratory distress.     Breath sounds: Normal  breath sounds.  Musculoskeletal:        General: Normal range of motion.     Cervical back: Normal range of motion and neck supple.  Lymphadenopathy:     Cervical: No cervical adenopathy.  Skin:    General: Skin is warm and dry.  Neurological:  Mental Status: She is alert and oriented to person, place, and time.  Psychiatric:        Mood and Affect: Mood normal.        Thought Content: Thought content normal.        Judgment: Judgment normal.   UC Treatments / Results  Labs (all labs ordered are listed, but only abnormal results are displayed) Labs Reviewed - No data to display  EKG   Radiology No results found.  Procedures Procedures (including critical care time)  Medications Ordered in UC Medications - No data to display  Initial Impression / Assessment and Plan / UC Course  I have reviewed the triage vital signs and the nursing notes.  Pertinent labs & imaging results that were available during my care of the patient were reviewed by me and considered in my medical decision making (see chart for details).     Elevated blood pressure in triage likely secondary to pain and lack of sleep.  Discussed continued recheck at home and if not improving once starting on the medication for dental pain that she should follow-up with PCP for recheck.-Diet, stress reduction reviewed as well.  Clindamycin, viscous lidocaine, ibuprofen and Tylenol as needed for the dental pain.  Close dental follow-up recommended to soon as possible.  Return for acutely worsening symptoms.  Final Clinical Impressions(s) / UC Diagnoses   Final diagnoses:  Dental infection  Elevated blood pressure reading   Discharge Instructions   None    ED Prescriptions     Medication Sig Dispense Auth. Provider   clindamycin (CLEOCIN) 300 MG capsule Take 1 capsule (300 mg total) by mouth 2 (two) times daily. 14 capsule Particia Nearing, PA-C   lidocaine (XYLOCAINE) 2 % solution Use as directed 10  mLs in the mouth or throat as needed for mouth pain. 200 mL Particia Nearing, [place]      PDMP not reviewed this encounter.   Particia Nearing, [place] 09/07/20 1558

## 2020-09-25 ENCOUNTER — Ambulatory Visit (INDEPENDENT_AMBULATORY_CARE_PROVIDER_SITE_OTHER): Payer: Medicaid Other | Admitting: Family Medicine

## 2020-09-25 ENCOUNTER — Other Ambulatory Visit: Payer: Self-pay

## 2020-09-25 ENCOUNTER — Encounter: Payer: Self-pay | Admitting: Family Medicine

## 2020-09-25 VITALS — BP 178/109 | HR 109 | Wt 321.2 lb

## 2020-09-25 DIAGNOSIS — I10 Essential (primary) hypertension: Secondary | ICD-10-CM | POA: Insufficient documentation

## 2020-09-25 DIAGNOSIS — M545 Low back pain, unspecified: Secondary | ICD-10-CM

## 2020-09-25 DIAGNOSIS — R069 Unspecified abnormalities of breathing: Secondary | ICD-10-CM | POA: Diagnosis present

## 2020-09-25 DIAGNOSIS — R0689 Other abnormalities of breathing: Secondary | ICD-10-CM | POA: Diagnosis not present

## 2020-09-25 MED ORDER — GABAPENTIN 300 MG PO CAPS: 300.0000 mg | ORAL_CAPSULE | Freq: Three times a day (TID) | ORAL | 3 refills | Status: DC

## 2020-09-25 MED ORDER — ADULT BLOOD PRESSURE CUFF LG KIT: 1.0000 [IU] | PACK | Freq: Every day | 0 refills | Status: AC

## 2020-09-25 MED ORDER — ALBUTEROL SULFATE HFA 108 (90 BASE) MCG/ACT IN AERS: 2.0000 | INHALATION_SPRAY | Freq: Four times a day (QID) | RESPIRATORY_TRACT | 3 refills | Status: AC | PRN

## 2020-09-25 MED ORDER — ONE-A-DAY WOMENS PO TABS: 1.0000 | ORAL_TABLET | Freq: Every day | ORAL | 1 refills | Status: AC

## 2020-09-25 MED ORDER — AMLODIPINE BESYLATE 5 MG PO TABS: 5.0000 mg | ORAL_TABLET | Freq: Every day | ORAL | 3 refills | Status: DC

## 2020-09-25 MED ORDER — LIDOCAINE 4 % EX PTCH: 1.0000 | MEDICATED_PATCH | Freq: Every day | CUTANEOUS | 0 refills | Status: AC

## 2020-09-25 NOTE — Patient Instructions (Addendum)
Thank you for choosing [facility] for your care today.  I have prescribed a medication called amlodipine 5 mg daily to help with lowering your blood pressure.  Please take this once daily.  Please follow-up with me in 1-2 weeks to do blood work and check your blood pressure.  I have also prescribed for you to get a blood pressure cuff, please measure your blood pressure once daily with goal of your blood pressure being less than 140/90.  Blood pressure is greater than 200/100 or dangerously elevated.  For your back pain, I will prescribe lidocaine patches to help with your pain.  I also recommend you continue to use your heating pad to help with the pain.  Your x-rays from her prior visit did not show any bony abnormalities or fractures.  Please follow-up with me in 1-2 weeks to talk more about your right arm nerve pain.  I will increase the dose of your gabapentin to 300 mg at night.

## 2020-09-25 NOTE — Progress Notes (Signed)
    SUBJECTIVE:   CHIEF COMPLAINT / HPI: back pain and blood pressure   Back Pain Back pain is characterized as a feeling of tightness with a pull when standing or over exerting herself. It is alleviated by heating pad, improved after increasing water intake, patches with vibration therapy. Worse with slight flexion but reports no pain with full back flexion. She feels that her muscle get tight. She reports some increased urinary frequency that has been going on for close to a year. She has not been wearing pads. She reports urine happens with laguthing or coughing. She denies experiencing numbness in her lower extremities.     Elevated Blood Pressure  She has not been measuring blood pressure at home and needs a monitor to check. She has HA and sometimes sees spots. She denies current symptoms at this time. She reports having a systolic in 200s before. Denies past of CVA or MI.   Concern for Sleep Apnea  Concern for breathing while sleeping. Partner and parents report noticing her with abnormal breathing patterns and gasping for air while sleeping. She has never had a sleep study.  She reports that she has been trying to lose weight. She reports daytime tiredness and takes up to 4 naps daily .   PERTINENT  PMH / PSH:  Obesity  HTN    OBJECTIVE:   BP (!) 178/109   Pulse (!) 109   Wt (!) 321 lb 3.2 oz (145.7 kg)   SpO2 99%   BMI 53.45 kg/m   General: female appearing stated age in no acute distress HEENT: MMM, no oral lesions noted,Neck non-tender without lymphadenopathy, masses or thyromegaly Cardio: Normal S1 and S2, no S3 or S4. Rhythm is regular. No murmurs or rubs.  Bilateral radial pulses palpable Pulm: Clear to auscultation bilaterally, no crackles, wheezing, or diminished breath sounds. Normal respiratory effort, stable on RA Abdomen: Bowel sounds normal. Abdomen soft and non-tender.  Extremities: No peripheral edema. Warm/ well perfused.   Back: No gross deformity,  scoliosis. TTP along paraspinal muscles of lumbar region.  No midline or bony TTP. ROM limited during extension, however patient demonstrates ability to flex spine and rotate bilaterally  Strength LEs 5/5 all muscle groups.   Negative SLRs. Sensation intact to light touch bilaterally.   ASSESSMENT/PLAN:   HTN (hypertension) Blood pressure elevated on 2 measurements in office today. Start amlodipine 5 mg. We will repeat metabolic panel and blood pressure measurements at follow-up in 1-2 weeks Patient prescribed blood pressure cuff to measure blood pressure at home  Lumbar back pain Tenderness in lumbar paraspinal muscles, no red flag symptoms reported. Patient has had negative xrays at prior visit for same problem.  - prescribed lidocaine patches  - patient to continue ibuprofen  - patient will continue to apply heating pad  - can consider PT at next visit if some improvement   Loud breathing during sleep Given patient's elevated BMI and report of episodes that are consistent with sleep apnea by family members, will refer pt to pulmonology for sleep study and evaluation for CPAP qHS    F/u in 2-3 weeks   Ronnald Ramp, MD [facility]

## 2020-09-25 NOTE — Assessment & Plan Note (Signed)
Blood pressure elevated on 2 measurements in office today. Start amlodipine 5 mg. We will repeat metabolic panel and blood pressure measurements at follow-up in 1-2 weeks Patient prescribed blood pressure cuff to measure blood pressure at home

## 2020-09-27 DIAGNOSIS — R0689 Other abnormalities of breathing: Secondary | ICD-10-CM | POA: Insufficient documentation

## 2020-09-27 NOTE — Assessment & Plan Note (Signed)
Given patient's elevated BMI and report of episodes that are consistent with sleep apnea by family members, will refer pt to pulmonology for sleep study and evaluation for CPAP qHS

## 2020-09-27 NOTE — Assessment & Plan Note (Signed)
Tenderness in lumbar paraspinal muscles, no red flag symptoms reported. Patient has had negative xrays at prior visit for same problem.  - prescribed lidocaine patches  - patient to continue ibuprofen  - patient will continue to apply heating pad  - can consider PT at next visit if some improvement

## 2020-10-14 NOTE — Progress Notes (Signed)
SUBJECTIVE:   CHIEF COMPLAINT / HPI: arm/back pain & HTN  HTN Patient reports that her blood pressure is improved from previous measurements.  She states that she has not had any problems taking her amlodipine regularly.  Patient reports that he does sometimes have vision changes when her blood pressure is elevated.  She reports that the best measurement she has been able to gather for her blood pressure was 130 over 90s.  She reports that she has been having less headaches.  She states that when she takes her amlodipine with her gabapentin she sometimes has migraines that she has since stopped taking the gabapentin and has had resolution of the migraines.  Patient is requesting a list of potential optometrists & ophthalmologists in the area. Patient denies chest pain or headaches at this time.  Arm/Back Pain  Patient reports that she continues to have RUE pain with lifting.  Patient reports that she is right-hand dominant.  She states that she feels weakness while using her right upper extremity during today.  She reports the lifting small objects such as her phone feels difficult.  She states that she is unable to do her own hair due to the weakness in the right upper extremity.  She describes pain that travels down the right lateral aspect of her right upper extremity.  She denies any swelling or trauma to the area.  She reports that she was diagnosed with tendinitis several years ago in the right upper extremity.  She denies any surgeries to this extremity.  Patient reports that she has been having this issue for close to 1 year.  She states that the arm pain does not bother her at night.  Patient reports that she feels the weakness and pain mostly during the day.  PERTINENT  PMH / PSH:  Right upper extremity radiculopathy Hypertension Obesity   OBJECTIVE:   BP (!) 158/109   Pulse 96   Wt (!) 320 lb 9.6 oz (145.4 kg)   SpO2 100%   BMI 53.35 kg/m   General: female appearing stated  age in no acute distress Cardio: Normal S1 and S2, no S3 or S4. Rhythm is regular. No murmurs or rubs.  Bilateral radial pulses palpable Pulm: Clear to auscultation bilaterally, no crackles, wheezing, or diminished breath sounds. Normal respiratory effort, stable on RA Abdomen: Bowel sounds normal. Abdomen soft and non-tender.  Extremities: No peripheral edema. Warm/ well perfused.  Neuro: pt alert and oriented x4, grip strength on right pain appears to be 4/5 in comparison to 5/5 in left hand, no thenar or hyperthenar atrophy bilaterally, patient has decreased sensation on lateral aspect of right upper extremity, normal sensation to light touch on left upper extremity, no obvious skin changes on right upper extremity, no signs of edema, no pain in right shoulder upon palpation of bony prominences, normal external rotation of right shoulder ASSESSMENT/PLAN:   Radicular pain of right upper extremity Patient continues to have right upper extremity weakness and neuropathic pain.  She reports that gabapentin previously prescribed caused her to have migraine so she stopped taking it.  Patient reports feeling some weakness with grip strength and picking up items with the right upper extremity.  Patient has some numbness on lateral aspect of right forearm during my exam. -Discontinue gabapentin 300 nightly -Prescribed Cymbalta 30 mg -Patient to follow-up in 2 weeks with blood pressure appointment -We will likely need neurology referral for nerve conduction studies  HTN (hypertension) Blood pressure elevated in office today  158/109.  Patient denies headache, chest pain at this time. -Counseled patient to continue amlodipine daily -Prescribed chlorthalidone 25 -Patient to follow-up in 2 weeks for BMP and blood pressure check -Patient counseled to continue taking blood pressure at home with goal of less than 140/90 -Patient reports some vision changes with elevated blood pressures, patient given list of  potential optometrist/ophthalmologist   Follow-up in 2 weeks for blood pressure check, right upper extremity pain follow-up Scheduled for 11/20/2020 with PCP for Pap smear  Ronnald Ramp, MD [facility]

## 2020-10-15 ENCOUNTER — Encounter: Payer: Self-pay | Admitting: Family Medicine

## 2020-10-15 ENCOUNTER — Other Ambulatory Visit: Payer: Self-pay

## 2020-10-15 ENCOUNTER — Ambulatory Visit (INDEPENDENT_AMBULATORY_CARE_PROVIDER_SITE_OTHER): Payer: Medicaid Other | Admitting: Family Medicine

## 2020-10-15 DIAGNOSIS — M792 Neuralgia and neuritis, unspecified: Secondary | ICD-10-CM | POA: Diagnosis present

## 2020-10-15 DIAGNOSIS — I10 Essential (primary) hypertension: Secondary | ICD-10-CM

## 2020-10-15 MED ORDER — DULOXETINE HCL 30 MG PO CPEP: 30.0000 mg | ORAL_CAPSULE | Freq: Every day | ORAL | 30 refills | Status: DC

## 2020-10-15 MED ORDER — CHLORTHALIDONE 25 MG PO TABS: 25.0000 mg | ORAL_TABLET | Freq: Every day | ORAL | 3 refills | Status: DC

## 2020-10-15 NOTE — Patient Instructions (Addendum)
Thank you for choosing [facility] for your health today.  We still have a better place to go to improve your blood pressure, I am adding chlorthalidone 25 mg daily.  Please continue to take your chlorthalidone and amlodipine to help with lowering your blood pressure.  Please continue to measure your blood pressure with the goals that we discussed at your prior visit.  Just as a reminder goal is to have top number less than 140 and bottom number less than 90.  You will be important to follow-up with me on September 14 as scheduled so that we can complete your Pap smear and see how your blood pressures doing.  If you begin to experience severe chest pain, blood pressure that is persistently greater than 200/120, difficulty breathing, worsening headache, please report to the ED for evaluation   Please follow up in two weeks for blood work and to discuss updates of your arm pain.

## 2020-10-15 NOTE — Assessment & Plan Note (Addendum)
Blood pressure elevated in office today 158/109.  Patient denies headache, chest pain at this time. -Counseled patient to continue amlodipine daily -Prescribed chlorthalidone 25 -Patient to follow-up in 2 weeks for BMP and blood pressure check -Patient counseled to continue taking blood pressure at home with goal of less than 140/90 -Patient reports some vision changes with elevated blood pressures, patient given list of potential optometrist/ophthalmologist

## 2020-10-15 NOTE — Assessment & Plan Note (Signed)
Patient continues to have right upper extremity weakness and neuropathic pain.  She reports that gabapentin previously prescribed caused her to have migraine so she stopped taking it.  Patient reports feeling some weakness with grip strength and picking up items with the right upper extremity.  Patient has some numbness on lateral aspect of right forearm during my exam. -Discontinue gabapentin 300 nightly -Prescribed Cymbalta 30 mg -Patient to follow-up in 2 weeks with blood pressure appointment -We will likely need neurology referral for nerve conduction studies

## 2020-11-20 ENCOUNTER — Ambulatory Visit: Payer: Medicaid Other | Admitting: Family Medicine

## 2021-03-24 ENCOUNTER — Ambulatory Visit: Payer: Medicaid Other | Admitting: Family Medicine

## 2021-06-04 ENCOUNTER — Ambulatory Visit: Payer: Medicaid Other | Admitting: Family Medicine

## 2021-06-04 NOTE — Progress Notes (Incomplete)
    SUBJECTIVE:   CHIEF COMPLAINT / HPI:   ***  PERTINENT  PMH / PSH: ***  OBJECTIVE:   There were no vitals taken for this visit.  ***  ASSESSMENT/PLAN:   No problem-specific Assessment & Plan notes found for this encounter.     Jayshaun Phillips Autry-Lott, DO [facility]   

## 2021-06-04 NOTE — Patient Instructions (Incomplete)
It was wonderful to see you today. ? ?Please bring ALL of your medications with you to every visit.  ? ?Today we talked about: ? ?** ? ?Please be sure to schedule follow up at the front  desk before you leave today.  ? ?If you haven't already, sign up for My Chart to have easy access to your labs results, and communication with your primary care physician. ? ?Please call the clinic at [phone] if your symptoms worsen or you have any concerns. It was our pleasure to serve you. ? ?Dr. Autry-Lott ? ?

## 2021-06-05 ENCOUNTER — Ambulatory Visit: Payer: Medicaid Other

## 2021-06-05 NOTE — Patient Instructions (Incomplete)
It was wonderful to see you today. ? ?Please bring ALL of your medications with you to every visit.  ? ?Today we talked about: ? ?** ? ? ?Thank you for choosing [facility].  ? ?Please call [phone] with any questions about today's appointment. ? ?Please be sure to schedule follow up at the front  desk before you leave today.  ? ?Eliyah Bazzi, DO ?PGY-2 Family Medicine   ?

## 2021-06-05 NOTE — Progress Notes (Deleted)
? ? ?  SUBJECTIVE:  ? ?CHIEF COMPLAINT / HPI:  ? ?Bowel Issues ?Teresa Crane is a 41 y.o. female who presents to the [facility] clinic today to discuss concerns regarding bowels. She reports ***  ? ? ?PERTINENT  PMH / PSH: HTN, iron-deficiency anemia, pre-diabetic  ? ?OBJECTIVE:  ? ?There were no vitals taken for this visit. *** ? ?General: NAD, pleasant, able to participate in exam ?Cardiac: RRR, no murmurs. ?Respiratory: CTAB, normal effort, No wheezes, rales or rhonchi ?Abdomen: Bowel sounds present, nontender, nondistended, no hepatosplenomegaly. ?Extremities: no edema or cyanosis. ?Skin: warm and dry, no rashes noted ?Psych: Normal affect and mood ? ?ASSESSMENT/PLAN:  ? ?No problem-specific Assessment & Plan notes found for this encounter. ?  ? ? ?Sabino Dick, DO ?[facility]  ? ?

## 2021-08-12 ENCOUNTER — Encounter: Payer: Self-pay | Admitting: *Deleted

## 2022-08-07 ENCOUNTER — Ambulatory Visit (HOSPITAL_COMMUNITY)
Admission: EM | Admit: 2022-08-07 | Discharge: 2022-08-07 | Disposition: A | Discharge status: Home / Self Care | Payer: Medicaid Other | Attending: Physician Assistant | Admitting: Physician Assistant

## 2022-08-07 ENCOUNTER — Encounter (HOSPITAL_COMMUNITY): Payer: Self-pay

## 2022-08-07 DIAGNOSIS — I10 Essential (primary) hypertension: Secondary | ICD-10-CM | POA: Diagnosis not present

## 2022-08-07 DIAGNOSIS — L0291 Cutaneous abscess, unspecified: Secondary | ICD-10-CM

## 2022-08-07 MED ORDER — SULFAMETHOXAZOLE-TRIMETHOPRIM 800-160 MG PO TABS: 1.0000 | ORAL_TABLET | Freq: Two times a day (BID) | ORAL | 0 refills | Status: AC

## 2022-08-07 NOTE — ED Notes (Signed)
Erin, PA notified of vitals after the recheck.

## 2022-08-07 NOTE — ED Provider Notes (Signed)
[facility]    CSN: [id number] Arrival date & time: 08/07/22  4098      History   Chief Complaint Chief Complaint  Patient presents with   Abscess    HPI Teresa Crane is a 42 y.o. female.   Patient presents today with a 3-day history of enlarging lesion on her right chest wall.  She reports that she has a history of recurrent abscesses with similar presentation that generally resolve with antibiotics.  She reports that discomfort is rated 6/7 on a 0-10 pain scale, described as aching, worse with palpation or manipulation of the area, no alleviating factors identified.  Denies any recent antibiotics in the past 90 days.  She denies any associated fever, nausea, vomiting.  Denies history of hidradenitis suppurativa, recurrent skin infections, history of MRSA.  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  Her blood pressure is elevated today.  She does have a history of hypertension and is currently prescribed amlodipine and chlorthalidone which she reports compliance with.  She reports that she has been under an increased amount of stress over the past 24 hours and believes that that combined with the pain is causing her blood pressure to be elevated.  Denies any chest pain, shortness of breath, headache, vision change, dizziness.    Past Medical History:  Diagnosis Date   Asthma    Elevated blood pressure reading    Obesity     Patient Active Problem List   Diagnosis Date Noted   Loud breathing during sleep 09/27/2020   HTN (hypertension) 09/25/2020   Healthcare maintenance 08/23/2020   History of iron deficiency anemia 08/23/2020   BMI 40.0-44.9, adult (HCC) 08/23/2020   Lumbar back pain 08/23/2020   Radicular pain of right upper extremity 08/23/2020   Closed fracture of right distal fibula 04/28/2018   Dislocation  of right ankle joint 04/28/2018   Ankle dislocation, right, initial encounter 04/28/2018    Past Surgical History:  Procedure Laterality Date   CYST EXCISION     KNEE SURGERY Left    ORIF ANKLE FRACTURE Right 04/28/2018   Procedure: OPEN REDUCTION INTERNAL FIXATION (ORIF) RIGHT ANKLE FRACTURE;  Surgeon: Teryl Lucy, MD;  Location: [facility] OR;  Service: Orthopedics;  Laterality: Right;    OB History   No obstetric history on file.      Home Medications    Prior to Admission medications   Medication Sig Start Date End Date Taking? Authorizing Provider  acetaminophen (TYLENOL) 500 MG tablet Take 1,000 mg by mouth daily as needed for headache (PAIN).    [provider]  albuterol (PROAIR HFA) 108 (90 Base) MCG/ACT inhaler Inhale 2 puffs into the lungs every 6 (six) hours as needed for wheezing or shortness of breath. 09/25/20   Simmons-Robinson, Makiera, MD  albuterol (PROVENTIL HFA;VENTOLIN HFA) 108 (90 Base) MCG/ACT inhaler Inhale 1-2 puffs into the lungs every 6 (six) hours as needed for wheezing or shortness of breath. 02/26/17   Street, Mercedes, PA-C  amLODipine (NORVASC) 5 MG tablet Take 1 tablet (5 mg total) by mouth at bedtime. 09/25/20   Simmons-Robinson, Tawanna Cooler, MD  Blood Pressure Monitoring (ADULT BLOOD PRESSURE CUFF LG) KIT 1 Units by Does not apply route daily. 09/25/20   Simmons-Robinson, Makiera, MD  chlorthalidone (HYGROTON) 25 MG tablet Take 1 tablet (25 mg total) by mouth daily. 10/15/20  Simmons-Robinson, Makiera, MD  DULoxetine (CYMBALTA) 30 MG capsule Take 1 capsule (30 mg total) by mouth daily. 10/15/20   Simmons-Robinson, Tawanna Cooler, MD  ibuprofen (ADVIL) 600 MG tablet Take 1 tablet (600 mg total) by mouth every 8 (eight) hours as needed for cramping. 08/20/20   Simmons-Robinson, Tawanna Cooler, MD  lidocaine (XYLOCAINE) 2 % solution Use as directed 10 mLs in the mouth or throat as needed for mouth pain. 09/07/20   Particia Nearing, PA-C  pseudoephedrine (SUDAFED) 120 MG  12 hr tablet Take 120 mg by mouth 2 (two) times daily.    [provider]  pseudoephedrine (SUDAFED) 30 MG tablet Take 30 mg by mouth every 4 (four) hours as needed for congestion.    [provider]  sulfamethoxazole-trimethoprim (BACTRIM DS) 800-160 MG tablet Take 1 tablet by mouth 2 (two) times daily for 7 days. 08/07/22 08/14/22 Yes Gaynor Genco K, PA-C  Multiple Vitamins-Calcium (ONE-A-DAY WOMENS PO) Take 1 tablet by mouth daily. Patient not taking: Reported on 09/25/2020    [provider]    Family History History reviewed. No pertinent family history.  Social History Social History   Tobacco Use   Smoking status: Former    Packs/day: 0.50    Years: 20.00    Additional pack years: 0.00    Total pack years: 10.00    Types: Cigarettes    Quit date: 05/22/2018    Years since quitting: 4.2   Smokeless tobacco: Never  Substance Use Topics   Alcohol use: Yes    Comment: social   Drug use: No     Allergies   Penicillins   Review of Systems Review of Systems  Constitutional:  Positive for activity change. Negative for appetite change, fatigue and fever.  Eyes:  Negative for visual disturbance.  Respiratory:  Negative for shortness of breath.   Cardiovascular:  Negative for chest pain and leg swelling.  Gastrointestinal:  Negative for abdominal pain, diarrhea, nausea and vomiting.  Skin:  Positive for color change. Negative for wound.  Neurological:  Negative for dizziness, light-headedness and headaches.     Physical Exam Triage Vital Signs ED Triage Vitals  Enc Vitals Group     BP 08/07/22 0914 (!) 183/113     Pulse Rate 08/07/22 0914 89     Resp 08/07/22 0914 18     Temp 08/07/22 0914 98.2 F (36.8 C)     Temp Source 08/07/22 0914 Oral     SpO2 08/07/22 0914 97 %     Weight --      Height --      Head Circumference --      Peak Flow --      Pain Score 08/07/22 0915 7     Pain Loc --      Pain Edu? --      Excl. in GC? --    No  data found.  Updated Vital Signs BP (!) 178/123 (BP Location: Left Arm)   Pulse 98   Temp 98.2 F (36.8 C) (Oral)   Resp 18   LMP 07/17/2022   SpO2 96%   Visual Acuity Right Eye Distance:   Left Eye Distance:   Bilateral Distance:    Right Eye Near:   Left Eye Near:    Bilateral Near:     Physical Exam Vitals reviewed.  Constitutional:      General: She is awake. She is not in acute distress.    Appearance: Normal appearance. She is well-developed. She is  not ill-appearing.  HENT:     Head: Normocephalic and atraumatic.  Cardiovascular:     Rate and Rhythm: Normal rate and regular rhythm.     Heart sounds: Normal heart sounds, S1 normal and S2 normal. No murmur heard. Pulmonary:     Effort: Pulmonary effort is normal.     Breath sounds: Normal breath sounds. No wheezing, rhonchi or rales.     Comments: Clear auscultation bilaterally Abdominal:     Palpations: Abdomen is soft.     Tenderness: There is no abdominal tenderness.  Skin:    Findings: Abscess present.          Comments: 3 cm x 1 cm ill-defined abscess without significant fluctuance noted right lateral chest wall and mid axillary line.  No streaking or evidence of lymphangitis.  No bleeding or drainage noted.  Psychiatric:        Behavior: Behavior is cooperative.      UC Treatments / Results  Labs (all labs ordered are listed, but only abnormal results are displayed) Labs Reviewed - No data to display  EKG   Radiology No results found.  Procedures Procedures (including critical care time)  Medications Ordered in UC Medications - No data to display  Initial Impression / Assessment and Plan / UC Course  I have reviewed the triage vital signs and the nursing notes.  Pertinent labs & imaging results that were available during my care of the patient were reviewed by me and considered in my medical decision making (see chart for details).     Patient is well-appearing, afebrile, nontoxic,  nontachycardic.  No significant fluctuance on exam so I&D was deferred.  Will treat with Bactrim DS twice daily for 7 days.  Discussed that if she develops any rash or lesions she should stop the medication to be seen immediately.  She can use warm compresses on this area as well as over-the-counter analgesics such as Tylenol.  Discussed if this lesion enlarges or becomes more painful or she develops any systemic symptoms including fever, nausea, vomiting, body aches she needs to be seen immediately.  Strict return precautions given.  Work excuse note provided requesting light duty to decrease his activity of upper extremities given this exacerbates her discomfort.  We did discuss that we are not able to give work restrictions and can only request this if employer and is up to the employer to determine if this is appropriate and can be accommodated.  Her blood pressure is elevated.  Patient believes this is situational.  Denies any signs/symptoms of endorgan damage.  She was encouraged to avoid NSAIDs, decongestants, caffeine, sodium.  She is to monitor her blood pressure at home and if this is persistently above 140/90 return here or see your primary care for medication adjustment.  Discussed that if she develops any chest pain, shortness of breath, headache, vision change, dizziness in setting of high blood pressure she needs to go to the emergency room immediately.  Strict return precautions given.  Final Clinical Impressions(s) / UC Diagnoses   Final diagnoses:  Abscess  Elevated blood pressure reading in office with diagnosis of hypertension     Discharge Instructions      Start Bactrim DS twice daily for 7 days.  If you develop any rash or lesions stop the medication to be seen immediately.  Use warm compresses on this area.  If this is not improving or if it enlarges please return to we can consider drainage.  Use Tylenol for pain.  If anything worsens and you have fever, rapid spread of  redness, increasing pain, nausea, vomiting you need to be seen immediately.  Your blood pressure was elevated.  I believe this is because of stress as we discussed. Please avoid NSAIDs (aspirin, ibuprofen/Advil, naproxen/Aleve), decongestants, caffeine, sodium.  Monitor your blood pressure at home.  If persistently above 140/90 you should return to consider adjusting medications.  If you develop any chest pain, shortness of breath, headache, vision change, dizziness in the setting of high blood pressure you need to go to the emergency room.      ED Prescriptions     Medication Sig Dispense Auth. Provider   sulfamethoxazole-trimethoprim (BACTRIM DS) 800-160 MG tablet Take 1 tablet by mouth 2 (two) times daily for 7 days. 14 tablet Simon Aaberg, Noberto Retort, PA-C      PDMP not reviewed this encounter.   Jeani Hawking, PA-C 08/07/22 1191

## 2022-08-07 NOTE — ED Triage Notes (Signed)
Pt c/o abscess under rt arm/breast area x3 days. States using her heating pad and warm showers with no relief. Denies drainage.

## 2022-08-07 NOTE — Discharge Instructions (Signed)
Start Bactrim DS twice daily for 7 days.  If you develop any rash or lesions stop the medication to be seen immediately.  Use warm compresses on this area.  If this is not improving or if it enlarges please return to we can consider drainage.  Use Tylenol for pain.  If anything worsens and you have fever, rapid spread of redness, increasing pain, nausea, vomiting you need to be seen immediately.  Your blood pressure was elevated.  I believe this is because of stress as we discussed. Please avoid NSAIDs (aspirin, ibuprofen/Advil, naproxen/Aleve), decongestants, caffeine, sodium.  Monitor your blood pressure at home.  If persistently above 140/90 you should return to consider adjusting medications.  If you develop any chest pain, shortness of breath, headache, vision change, dizziness in the setting of high blood pressure you need to go to the emergency room.

## 2022-09-21 ENCOUNTER — Encounter: Payer: 59 | Admitting: Student

## 2022-11-29 IMAGING — CR DG CERVICAL SPINE COMPLETE 4+V
5 series · 5 of 5 positions shown · non-contrast
Comparison: None.

CLINICAL DATA: Right neck pain radiating down right arm

EXAM:
CERVICAL SPINE - COMPLETE 4+ VIEW

[w cervical spine lat]
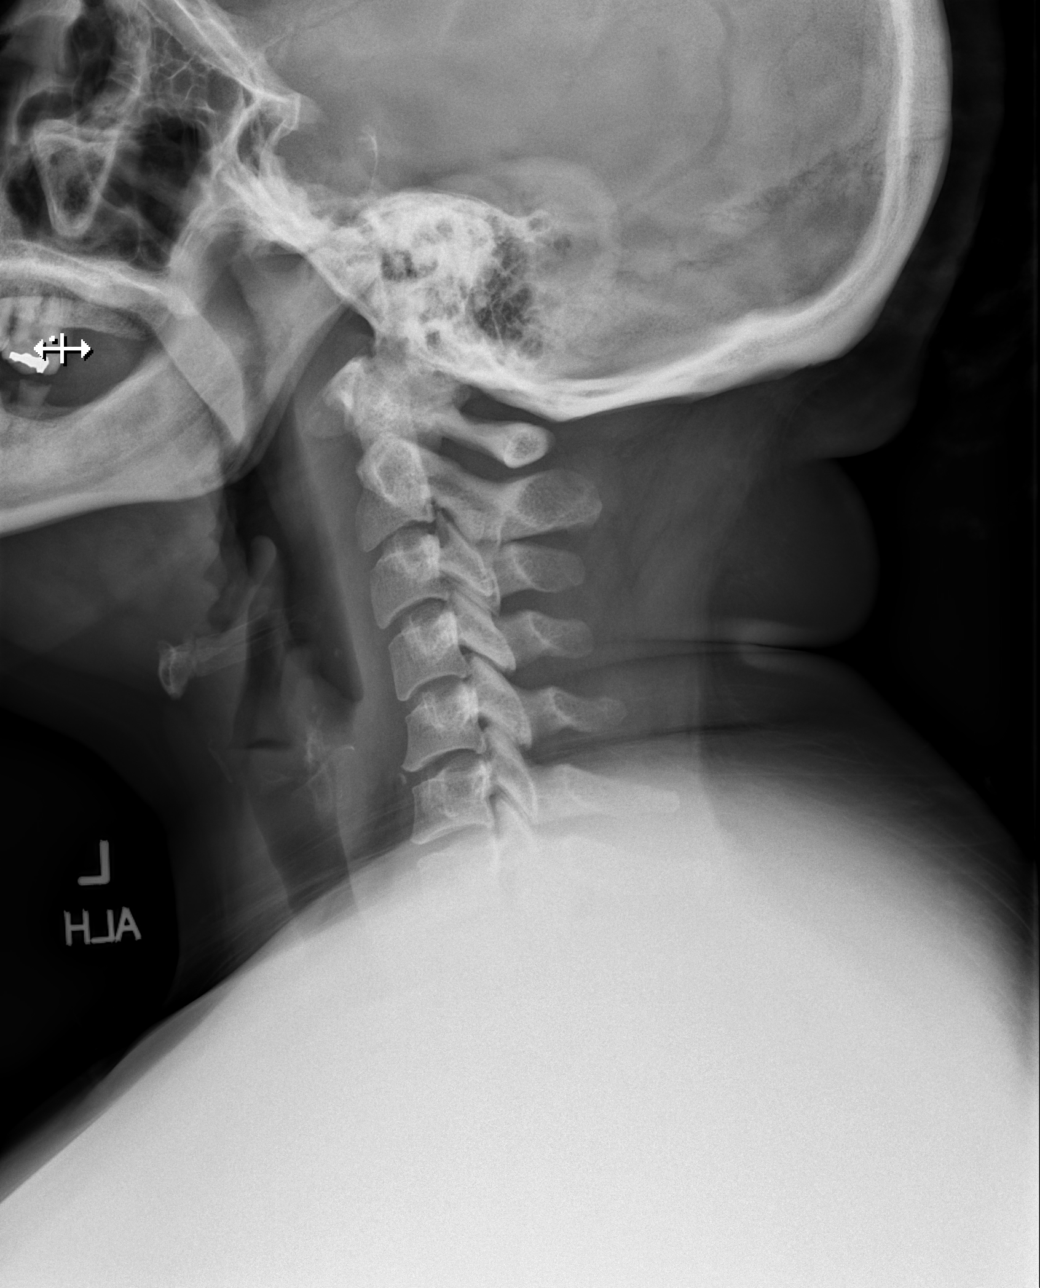

[w cervical spine ap_obl (1 of 2)]
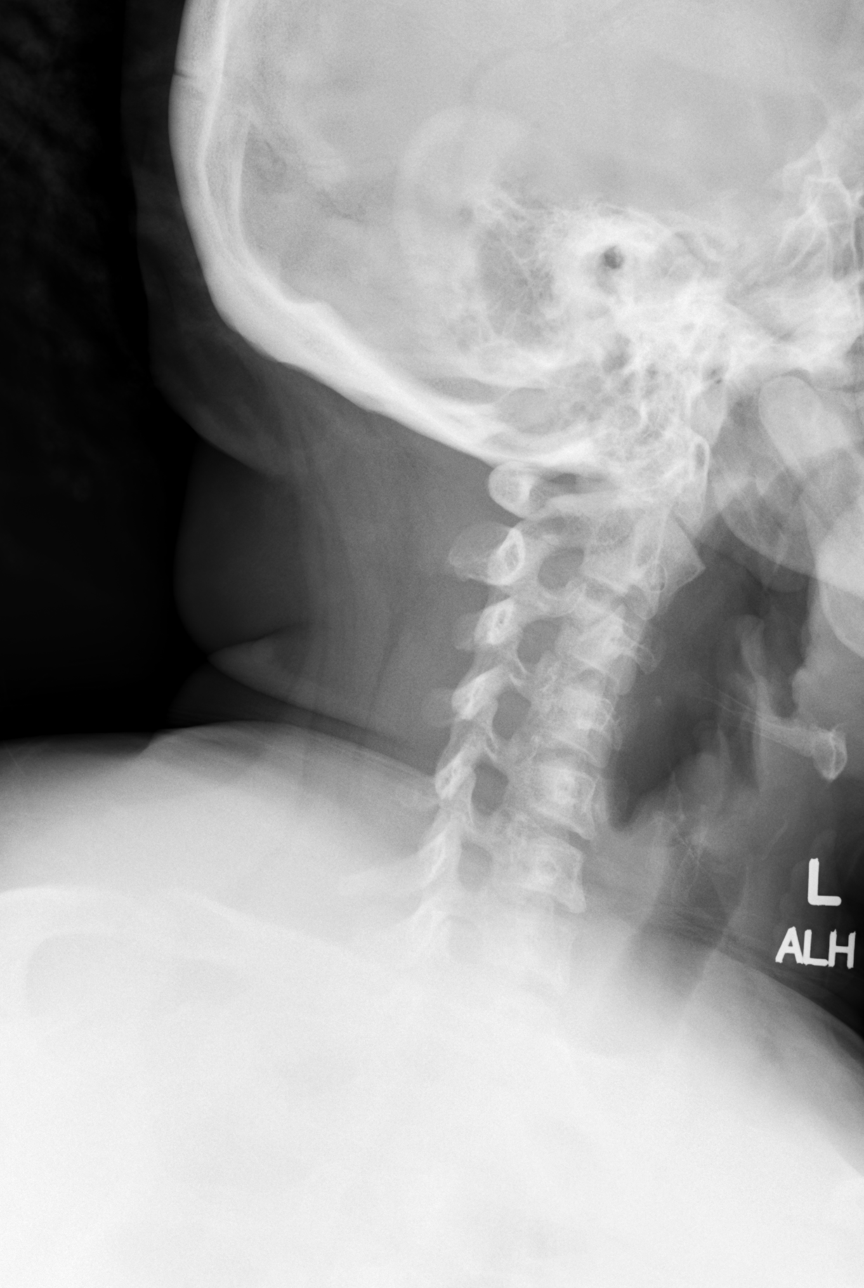

[w cervical spine ap_obl (2 of 2)]
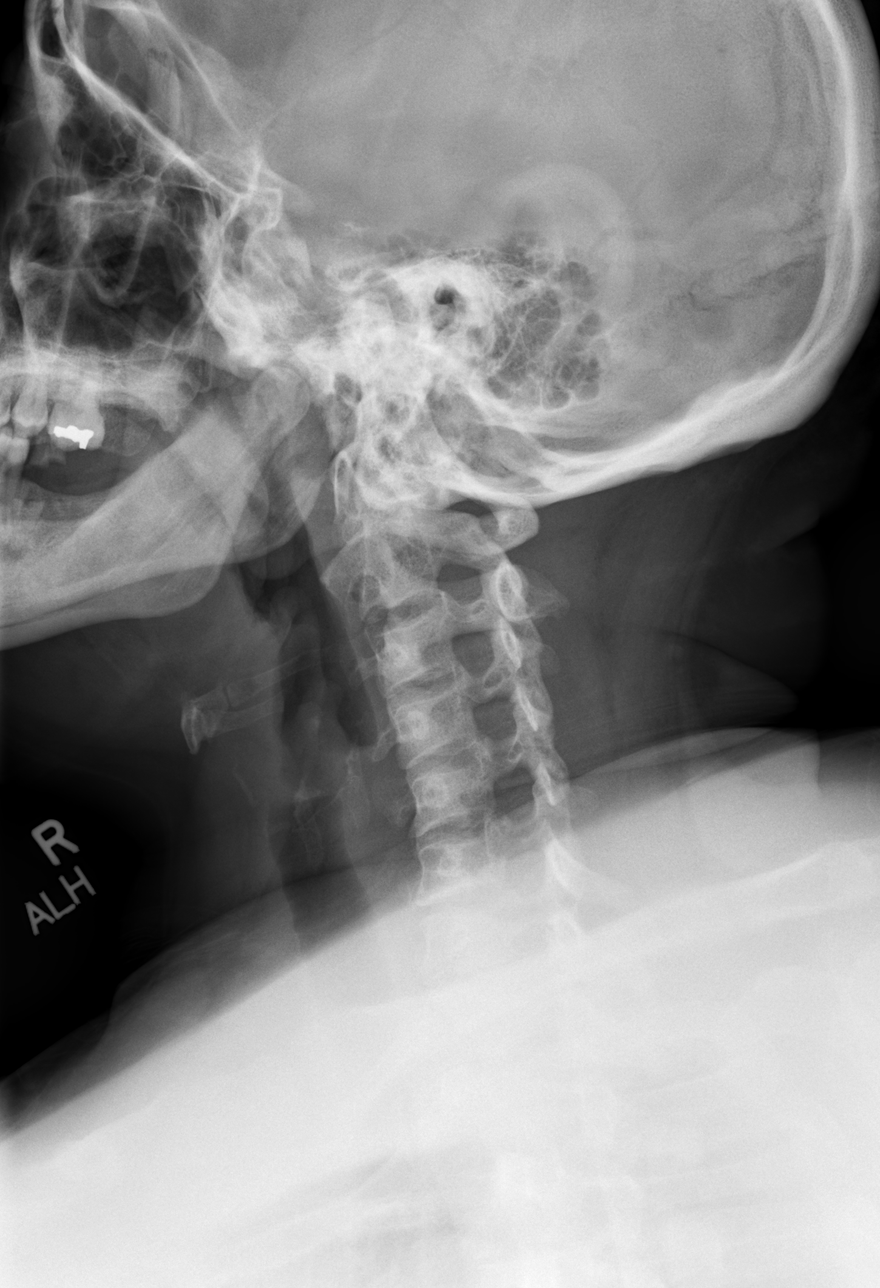

[w cervical spine ap]
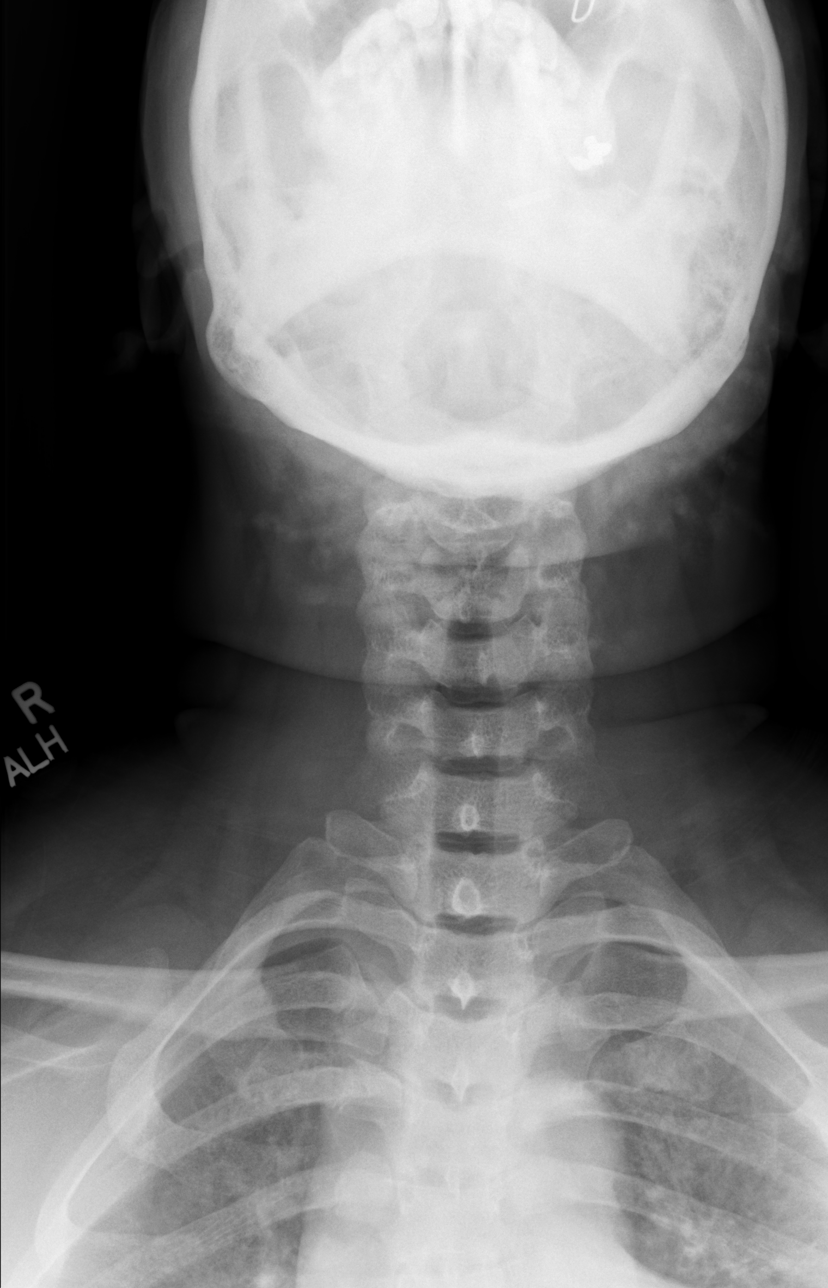

[w cervical spine odontoid]
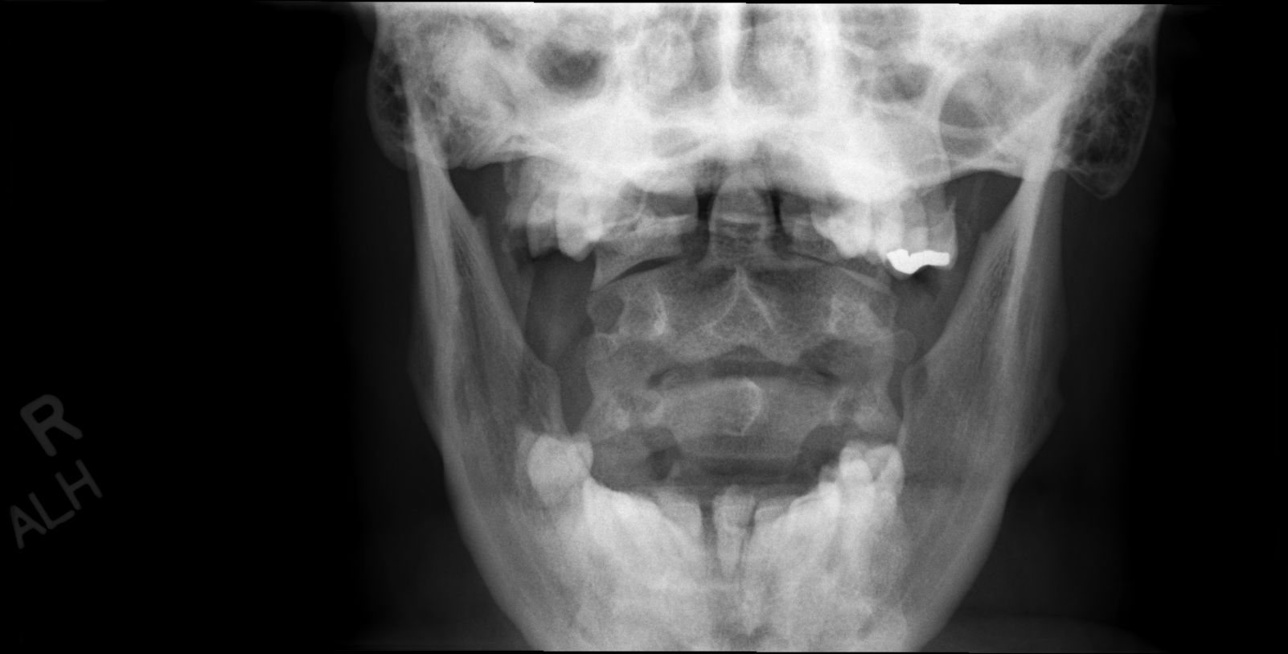

[5 of 5 positions shown; findings below may reference images not displayed]

FINDINGS: No prevertebral soft tissue swelling. Cervical vertebral body
heights and alignment are maintained. There is no substantial disc
space narrowing. There is no osseous encroachment on the neural
foramina.
IMPRESSION: Negative cervical spine radiographs.

## 2023-07-09 ENCOUNTER — Emergency Department (HOSPITAL_COMMUNITY)

## 2023-07-09 ENCOUNTER — Other Ambulatory Visit: Payer: Self-pay

## 2023-07-09 ENCOUNTER — Observation Stay (HOSPITAL_COMMUNITY)
Admission: EM | Admit: 2023-07-09 | Discharge: 2023-07-11 | Disposition: A | Discharge status: Home / Self Care | Attending: Internal Medicine | Admitting: Internal Medicine

## 2023-07-09 DIAGNOSIS — E876 Hypokalemia: Secondary | ICD-10-CM | POA: Diagnosis not present

## 2023-07-09 DIAGNOSIS — G4733 Obstructive sleep apnea (adult) (pediatric): Secondary | ICD-10-CM | POA: Diagnosis not present

## 2023-07-09 DIAGNOSIS — R778 Other specified abnormalities of plasma proteins: Secondary | ICD-10-CM | POA: Insufficient documentation

## 2023-07-09 DIAGNOSIS — R0600 Dyspnea, unspecified: Secondary | ICD-10-CM | POA: Diagnosis not present

## 2023-07-09 DIAGNOSIS — R0602 Shortness of breath: Secondary | ICD-10-CM | POA: Diagnosis present

## 2023-07-09 DIAGNOSIS — Z825 Family history of asthma and other chronic lower respiratory diseases: Secondary | ICD-10-CM | POA: Insufficient documentation

## 2023-07-09 DIAGNOSIS — I110 Hypertensive heart disease with heart failure: Secondary | ICD-10-CM | POA: Diagnosis not present

## 2023-07-09 DIAGNOSIS — Z79899 Other long term (current) drug therapy: Secondary | ICD-10-CM | POA: Diagnosis not present

## 2023-07-09 DIAGNOSIS — I161 Hypertensive emergency: Secondary | ICD-10-CM | POA: Insufficient documentation

## 2023-07-09 DIAGNOSIS — E66813 Obesity, class 3: Secondary | ICD-10-CM | POA: Diagnosis not present

## 2023-07-09 DIAGNOSIS — J4520 Mild intermittent asthma, uncomplicated: Secondary | ICD-10-CM | POA: Insufficient documentation

## 2023-07-09 DIAGNOSIS — Z6841 Body Mass Index (BMI) 40.0 and over, adult: Secondary | ICD-10-CM

## 2023-07-09 DIAGNOSIS — I160 Hypertensive urgency: Principal | ICD-10-CM | POA: Diagnosis present

## 2023-07-09 DIAGNOSIS — R519 Headache, unspecified: Secondary | ICD-10-CM | POA: Insufficient documentation

## 2023-07-09 DIAGNOSIS — I5042 Chronic combined systolic (congestive) and diastolic (congestive) heart failure: Secondary | ICD-10-CM | POA: Diagnosis not present

## 2023-07-09 DIAGNOSIS — Z96652 Presence of left artificial knee joint: Secondary | ICD-10-CM | POA: Diagnosis not present

## 2023-07-09 DIAGNOSIS — D509 Iron deficiency anemia, unspecified: Secondary | ICD-10-CM | POA: Insufficient documentation

## 2023-07-09 DIAGNOSIS — R7989 Other specified abnormal findings of blood chemistry: Secondary | ICD-10-CM | POA: Insufficient documentation

## 2023-07-09 DIAGNOSIS — J45909 Unspecified asthma, uncomplicated: Secondary | ICD-10-CM

## 2023-07-09 DIAGNOSIS — Z87891 Personal history of nicotine dependence: Secondary | ICD-10-CM | POA: Insufficient documentation

## 2023-07-09 LAB — COMPREHENSIVE METABOLIC PANEL WITH GFR
ALT: 20 U/L (ref 0–44)
AST: 18 U/L (ref 15–41)
Albumin: 3.1 g/dL — ABNORMAL LOW (ref 3.5–5.0)
Alkaline Phosphatase: 63 U/L (ref 38–126)
Anion gap: 8 (ref 5–15)
BUN: 13 mg/dL (ref 6–20)
CO2: 24 mmol/L (ref 22–32)
Calcium: 8.5 mg/dL — ABNORMAL LOW (ref 8.9–10.3)
Chloride: 104 mmol/L (ref 98–111)
Creatinine, Ser: 0.9 mg/dL (ref 0.44–1.00)
GFR, Estimated: >60 mL/min (ref >60)
Glucose, Bld: 127 mg/dL — ABNORMAL HIGH (ref 70–99)
Potassium: 3.4 mmol/L — ABNORMAL LOW (ref 3.5–5.1)
Sodium: 136 mmol/L (ref 135–145)
Total Bilirubin: 0.8 mg/dL (ref 0.0–1.2)
Total Protein: 7 g/dL (ref 6.5–8.1)

## 2023-07-09 LAB — CBC WITH DIFFERENTIAL/PLATELET
Abs Immature Granulocytes: 0.01 10*3/uL (ref 0.00–0.07)
Basophils Absolute: 0 10*3/uL (ref 0.0–0.1)
Basophils Relative: 1 %
Eosinophils Absolute: 0.3 10*3/uL (ref 0.0–0.5)
Eosinophils Relative: 5 %
HCT: 34.3 % — ABNORMAL LOW (ref 36.0–46.0)
Hemoglobin: 10.3 g/dL — ABNORMAL LOW (ref 12.0–15.0)
Immature Granulocytes: 0 %
Lymphocytes Relative: 41 %
Lymphs Abs: 2.3 10*3/uL (ref 0.7–4.0)
MCH: 22.2 pg — ABNORMAL LOW (ref 26.0–34.0)
MCHC: 30 g/dL (ref 30.0–36.0)
MCV: 73.9 fL — ABNORMAL LOW (ref 80.0–100.0)
Monocytes Absolute: 0.5 10*3/uL (ref 0.1–1.0)
Monocytes Relative: 8 %
Neutro Abs: 2.6 10*3/uL (ref 1.7–7.7)
Neutrophils Relative %: 45 %
Platelets: 277 10*3/uL (ref 150–400)
RBC: 4.64 MIL/uL (ref 3.87–5.11)
RDW: 20.1 % — ABNORMAL HIGH (ref 11.5–15.5)
WBC: 5.7 10*3/uL (ref 4.0–10.5)
nRBC: 0 % (ref 0.0–0.2)

## 2023-07-09 LAB — TROPONIN I (HIGH SENSITIVITY)
Troponin I (High Sensitivity): 22 ng/L — ABNORMAL HIGH (ref <18)
Troponin I (High Sensitivity): 16 ng/L (ref <18)

## 2023-07-09 LAB — BLOOD GAS, VENOUS
Acid-Base Excess: 2 mmol/L (ref 0.0–2.0)
Bicarbonate: 27.8 mmol/L (ref 20.0–28.0)
O2 Saturation: 57.4 %
Patient temperature: 37
pCO2, Ven: 47 mmHg (ref 44–60)
pH, Ven: 7.38 (ref 7.25–7.43)
pO2, Ven: 34 mmHg (ref 32–45)

## 2023-07-09 LAB — D-DIMER, QUANTITATIVE: D-Dimer, Quant: 1.64 ug{FEU}/mL — ABNORMAL HIGH (ref 0.00–0.50)

## 2023-07-09 LAB — BRAIN NATRIURETIC PEPTIDE: B Natriuretic Peptide: 271.1 pg/mL — ABNORMAL HIGH (ref 0.0–100.0)

## 2023-07-09 LAB — HCG, SERUM, QUALITATIVE: Preg, Serum: NEGATIVE

## 2023-07-09 MED ORDER — ONDANSETRON HCL 4 MG/2ML IJ SOLN: 4.0000 mg | Freq: Four times a day (QID) | INTRAMUSCULAR | Status: DC | PRN

## 2023-07-09 MED ORDER — ACETAMINOPHEN 650 MG RE SUPP: 650.0000 mg | Freq: Four times a day (QID) | RECTAL | Status: DC | PRN

## 2023-07-09 MED ORDER — ENOXAPARIN SODIUM 40 MG/0.4ML IJ SOSY
40.0000 mg | PREFILLED_SYRINGE | INTRAMUSCULAR | Status: DC
  Administered 2023-07-10 – 2023-07-11 (×2): 40 mg via SUBCUTANEOUS
  Filled 2023-07-09 (×2): qty 0.4

## 2023-07-09 MED ORDER — ALBUTEROL SULFATE (2.5 MG/3ML) 0.083% IN NEBU
2.5000 mg | INHALATION_SOLUTION | Freq: Four times a day (QID) | RESPIRATORY_TRACT | Status: DC | PRN
  Filled 2023-07-09: qty 3

## 2023-07-09 MED ORDER — AMLODIPINE BESYLATE 5 MG PO TABS
10.0000 mg | ORAL_TABLET | Freq: Once | ORAL | Status: AC
  Administered 2023-07-09: 10 mg via ORAL
  Filled 2023-07-09: qty 2

## 2023-07-09 MED ORDER — LABETALOL HCL 5 MG/ML IV SOLN: 10.0000 mg | Freq: Once | INTRAVENOUS | Status: DC

## 2023-07-09 MED ORDER — ONDANSETRON HCL 4 MG PO TABS: 4.0000 mg | ORAL_TABLET | Freq: Four times a day (QID) | ORAL | Status: DC | PRN

## 2023-07-09 MED ORDER — FLUTICASONE FUROATE-VILANTEROL 100-25 MCG/ACT IN AEPB
1.0000 | INHALATION_SPRAY | Freq: Every day | RESPIRATORY_TRACT | Status: DC
Start: 2023-07-10 — End: 2023-07-11
  Administered 2023-07-10: 1 via RESPIRATORY_TRACT
  Filled 2023-07-09: qty 28

## 2023-07-09 MED ORDER — LABETALOL HCL 5 MG/ML IV SOLN
10.0000 mg | Freq: Once | INTRAVENOUS | Status: AC
  Administered 2023-07-09: 10 mg via INTRAVENOUS
  Filled 2023-07-09: qty 4

## 2023-07-09 MED ORDER — ACETAMINOPHEN 325 MG PO TABS: 650.0000 mg | ORAL_TABLET | Freq: Four times a day (QID) | ORAL | Status: DC | PRN

## 2023-07-09 MED ORDER — IOHEXOL 350 MG/ML SOLN
80.0000 mL | Freq: Once | INTRAVENOUS | Status: AC | PRN
  Administered 2023-07-09: 80 mL via INTRAVENOUS

## 2023-07-09 MED ORDER — SENNOSIDES-DOCUSATE SODIUM 8.6-50 MG PO TABS: 1.0000 | ORAL_TABLET | Freq: Every evening | ORAL | Status: DC | PRN

## 2023-07-09 NOTE — ED Triage Notes (Signed)
 Pt states that she has been short of breath for 1 week. Pt does have a history of asthma and currently managing it with medication. Pt also states that she has wet the bed every night for last 5 days and that is very unusual.

## 2023-07-09 NOTE — H&P (Incomplete)
 History and Physical  Teresa Crane AVW:[id number] DOB: Sep 28, 1980 DOA: 07/09/2023  PCP: Wilhemena Harbour, MD   Chief Complaint: Shortness of breath  HPI: Teresa Crane is a 43 y.o. female with medical history significant for morbid obesity, hypertension and asthma who presented to the ED for evaluation of shortness of breath.  Patient reports that over the last week, she has had progressive shortness of breath mostly with exertion but also after waking up.  She endorsed trouble breathing after walking up 3-4 flight of stairs.  She also reports often gasping for air at night and feeling fatigued even after sitting for a long time.  Over the last week, she has also had nocturia with bladder incontinence which is new for her.  She endorsed occasional wheezing at home and states she has run out of her nebulizer solution.  She occasionally has swelling around her ankle but not today.  She snores at night and has been told by her husband that she briefly stops breathing in her sleep.  She was previously on blood pressure medications (amlodipine  and chlorthalidone ) but reports that they caused her to have severe constipation so she stopped taking them a year ago and the constipation resolved. Reports her BP has been elevated at home with SBP in the 140s to 150s. She has had a significant weight gain over the last few years after surgery on her ankle limiting her ability to exercise. She denies any chest pain, nausea, vomiting, abdominal pain, dizziness, vision changes, dysuria, fevers, chills or cough.  ED Course: Initial vitals showed temp 98, RR 17, HR 102, BP 193/144, SpO2 98% on room air.  Labs significant for sodium 136, K+ 3.4, blood glucose 127, creatinine 0.90, WBC 5.7, Hgb 10.3, D-dimer 1.64, BNP 271, negative pregnancy test, normal VBG, troponin 22->16.  EKG shows sinus rhythm with possible biatrial enlargement. CXR.  No acute cardiopulmonary disease.  CTA chest PE study negative for PE but shows  mild cardiomegaly.  Patient received IV labetalol  10 mg x 1 and amlodipine  10 mg x 1. TR[facility] was consulted for admission.  Review of Systems: Please see HPI for pertinent positives and negatives. A complete 10 system review of systems are otherwise negative.  Past Medical History:  Diagnosis Date  . Asthma   . Elevated blood pressure reading   . Obesity    Past Surgical History:  Procedure Laterality Date  . CYST EXCISION    . KNEE SURGERY Left   . ORIF ANKLE FRACTURE Right 04/28/2018   Procedure: OPEN REDUCTION INTERNAL FIXATION (ORIF) RIGHT ANKLE FRACTURE;  Surgeon: Osa Blase, MD;  Location: [facility] OR;  Service: Orthopedics;  Laterality: Right;   Social History:  reports that she quit smoking about 5 years ago. Her smoking use included cigarettes. She started smoking about 25 years ago. She has a 10 pack-year smoking history. She has never used smokeless tobacco. She reports current alcohol use. She reports that she does not use drugs.  Allergies  Allergen Reactions  . Penicillins     Vaginal issue Has patient had a PCN reaction causing immediate rash, facial/tongue/throat swelling, SOB or lightheadedness with hypotension: No Has patient had a PCN reaction causing severe rash involving mucus membranes or skin necrosis: No Has patient had a PCN reaction that required hospitalization: No Has patient had a PCN reaction occurring within the last 10 years: No If all of the above answers are "NO", then may proceed with Cephalosporin use.     No family history on  file.   Prior to Admission medications   Medication Sig Start Date End Date Taking? Authorizing Provider  albuterol  (PROAIR  HFA) 108 (90 Base) MCG/ACT inhaler Inhale 2 puffs into the lungs every 6 (six) hours as needed for wheezing or shortness of breath. 09/25/20  Yes Simmons-Robinson, Makiera, MD  acetaminophen (TYLENOL) 500 MG tablet Take 1,000 mg by mouth daily as needed for headache (PAIN). Patient not taking: Reported on  07/09/2023    [provider]  albuterol  (PROVENTIL  HFA;VENTOLIN  HFA) 108 (90 Base) MCG/ACT inhaler Inhale 1-2 puffs into the lungs every 6 (six) hours as needed for wheezing or shortness of breath. Patient not taking: Reported on 07/09/2023 02/26/17   Street, [place], PA-C  amLODipine  (NORVASC ) 5 MG tablet Take 1 tablet (5 mg total) by mouth at bedtime. Patient not taking: Reported on 07/09/2023 09/25/20   Simmons-Robinson, Judyann Number, MD  Blood Pressure Monitoring (ADULT BLOOD PRESSURE CUFF LG) KIT 1 Units by Does not apply route daily. Patient not taking: Reported on 07/09/2023 09/25/20   Simmons-Robinson, Judyann Number, MD  chlorthalidone  (HYGROTON ) 25 MG tablet Take 1 tablet (25 mg total) by mouth daily. Patient not taking: Reported on 07/09/2023 10/15/20   Simmons-Robinson, Judyann Number, MD  DULoxetine  (CYMBALTA ) 30 MG capsule Take 1 capsule (30 mg total) by mouth daily. Patient not taking: Reported on 07/09/2023 10/15/20   Simmons-Robinson, Judyann Number, MD  ibuprofen  (ADVIL ) 600 MG tablet Take 1 tablet (600 mg total) by mouth every 8 (eight) hours as needed for cramping. Patient not taking: Reported on 07/09/2023 08/20/20   Simmons-Robinson, Judyann Number, MD  lidocaine  (XYLOCAINE ) 2 % solution Use as directed 10 mLs in the mouth or throat as needed for mouth pain. Patient not taking: Reported on 07/09/2023 09/07/20   Corbin Dess, PA-C  pseudoephedrine (SUDAFED) 120 MG 12 hr tablet Take 120 mg by mouth 2 (two) times daily. Patient not taking: Reported on 07/09/2023    [provider]  pseudoephedrine (SUDAFED) 30 MG tablet Take 30 mg by mouth every 4 (four) hours as needed for congestion. Patient not taking: Reported on 07/09/2023    [provider]  Multiple Vitamins-Calcium (ONE-A-DAY WOMENS PO) Take 1 tablet by mouth daily. Patient not taking: Reported on 09/25/2020    [provider]    Physical Exam: BP (!) 158/94   Pulse 80   Temp 98.4 F (36.9 C) (Oral)   Resp 18   Ht 5\' 5"   (1.651 m)   Wt 131.5 kg   LMP 06/16/2023   SpO2 98%   BMI 48.26 kg/m  General: Pleasant, well-appearing morbidly obese woman laying in bed. No acute distress. HEENT: [place]/AT. Anicteric sclera Neck: Supple. JVD difficult to assess due to body habitus CV: RRR. No murmurs, rubs, or gallops. No LE edema Pulmonary: Lungs CTAB. Normal effort. No wheezing or rales. Decreased air movement throughout. Abdominal: Soft, nontender, nondistended. Normal bowel sounds. Extremities: Palpable radial and DP pulses. Normal ROM. Skin: Warm and dry. No obvious rash or lesions. Neuro: A&Ox3. Moves all extremities. Normal sensation to light touch. No focal deficit. Psych: Normal mood and affect          Labs on Admission:  Basic Metabolic Panel: Recent Labs  Lab 07/09/23 1730  NA 136  K 3.4*  CL 104  CO2 24  GLUCOSE 127*  BUN 13  CREATININE 0.90  CALCIUM 8.5*   Liver Function Tests: Recent Labs  Lab 07/09/23 1730  AST 18  ALT 20  ALKPHOS 63  BILITOT 0.8  PROT 7.0  ALBUMIN 3.1*   No results for input(s): "LIPASE", "AMYLASE" in the last 168 hours. No results for input(s): "AMMONIA" in the last 168 hours. CBC: Recent Labs  Lab 07/09/23 1730  WBC 5.7  NEUTROABS 2.6  HGB 10.3*  HCT 34.3*  MCV 73.9*  PLT 277   Cardiac Enzymes: No results for input(s): "CKTOTAL", "CKMB", "CKMBINDEX", "TROPONINI" in the last 168 hours. BNP (last 3 results) Recent Labs    07/09/23 1730  BNP 271.1*    ProBNP (last 3 results) No results for input(s): "PROBNP" in the last 8760 hours.  CBG: No results for input(s): "GLUCAP" in the last 168 hours.  Radiological Exams on Admission: CT Angio Chest PE W/Cm &/Or Wo Cm Result Date: 07/09/2023 CLINICAL DATA:  Shortness of breath. EXAM: CT ANGIOGRAPHY CHEST WITH CONTRAST TECHNIQUE: Multidetector CT imaging of the chest was performed using the standard protocol during bolus administration of intravenous contrast. Multiplanar CT image reconstructions and  MIPs were obtained to evaluate the vascular anatomy. RADIATION DOSE REDUCTION: This exam was performed according to the departmental dose-optimization program which includes automated exposure control, adjustment of the mA and/or kV according to patient size and/or use of iterative reconstruction technique. CONTRAST:  80mL OMNIPAQUE IOHEXOL 350 MG/ML SOLN COMPARISON:  None Available. FINDINGS: Cardiovascular: Mild cardiomegaly. No evidence of aortic aneurysm or dissection. No filling defects in the pulmonary arteries to suggest pulmonary emboli. Mediastinum/Nodes: No mediastinal, hilar, or axillary adenopathy. Trachea and esophagus are unremarkable. Thyroid unremarkable. Lungs/Pleura: Lungs are clear. No focal airspace opacities or suspicious nodules. No effusions. Upper Abdomen: No acute findings Musculoskeletal: Chest wall soft tissues are unremarkable. No acute bony abnormality. Review of the MIP images confirms the above findings. IMPRESSION: No evidence of pulmonary embolus. Mild cardiomegaly. No acute cardiopulmonary disease. Electronically Signed   By: Janeece Mechanic M.D.   On: 07/09/2023 20:18   DG Chest 2 View Result Date: 07/09/2023 CLINICAL DATA:  Shortness of breath. EXAM: CHEST - 2 VIEW COMPARISON:  February 26, 2017. FINDINGS: Stable cardiomediastinal silhouette. Both lungs are clear. The visualized skeletal structures are unremarkable. IMPRESSION: No active cardiopulmonary disease. Electronically Signed   By: Rosalene Colon M.D.   On: 07/09/2023 18:04   Assessment/Plan Teresa Crane is a 43 y.o. female with medical history significant for morbid obesity, hypertension and asthma who presented to the ED for evaluation of shortness of breath and admitted for hypertensive urgency.  # Hypertensive urgency # Severe hypertension - Obese middle-age woman presented with SOB/DOE found to have severely elevated BP with SBP in the 180s to 190s - This is in the setting of morbid obesity and patient  being off BP meds over the last year - No focal neurodeficits on exam and no chest pain - SBP improved to the 150s after IV labetalol  10 mg x 1 and amlodipine  10 mg in the ED - Start Coreg 12.5 mg twice daily - PRN labetalol  for SBP >180  # Shortness of breath # Dyspnea on exertion - Endorse increased SOB and dyspnea on exertion over the last week with associated PND and occasional ankle swelling - CT chest shows mild cardiomegaly, BNP elevated to 271 - Patient euvolemic on exam - Diastolic heart failure very likely in the setting of uncontrolled hypertension and morbid obesity - Check echocardiogram - Supplemental O2 as needed  # OSA - Morbidly obese patient with reported snoring, episodes of apnea during sleep, hypertension and unrefreshing sleep - STOP-BANG score of 6, indicates high risk for moderate to severe  OSA - Trial of CPAP  # Mild intermittent asthma  # Class III obesity Body mass index is 48.26 kg/m.   #***  #***  #***  DVT prophylaxis: Lovenox     Code Status: Full Code  Consults called: None  Family Communication: Discussed admission with parents at bedside  Severity of Illness: The appropriate patient status for this patient is OBSERVATION. Observation status is judged to be reasonable and necessary in order to provide the required intensity of service to ensure the patient's safety. The patient's presenting symptoms, physical exam findings, and initial radiographic and laboratory data in the context of their medical condition is felt to place them at decreased risk for further clinical deterioration. Furthermore, it is anticipated that the patient will be medically stable for discharge from the hospital within 2 midnights of admission.   Level of care: Telemetry   This record has been created using Conservation officer, historic buildings. Errors have been sought and corrected, but may not always be located. Such creation errors do not reflect on the standard of  care.   Vita Grip, MD 07/09/2023, 11:35 PM [facility] Pager: [phone] Isaiah 41:10   If 7PM-7AM, please contact night-coverage [email or web address] Password [email or web address]

## 2023-07-09 NOTE — H&P (Signed)
 History and Physical  [name]:[id number] DOB: [birth year] DOA: 07/09/2023  PCP: Wilhemena Harbour, MD   Chief Complaint: Shortness of breath  HPI: Teresa Crane is a 43 y.o. female with medical history significant for morbid obesity, hypertension and asthma who presented to the ED for evaluation of shortness of breath.  Patient reports that over the last week, she has had progressive shortness of breath mostly with exertion but also after waking up.  She endorsed trouble breathing after walking up 3-4 flight of stairs.  She also reports often gasping for air at night and feeling fatigued even after sitting for a long time.  Over the last week, she has also had nocturia with bladder incontinence which is new for her.  She endorsed occasional wheezing at home and states she has run out of her nebulizer solution.  She occasionally has swelling around her ankle but not today.  She snores at night and has been told by her husband that she briefly stops breathing in her sleep.  She was previously on blood pressure medications (amlodipine  and chlorthalidone ) but reports that they caused her to have severe constipation so she stopped taking them a year ago and the constipation resolved. Reports her BP has been elevated at home with SBP in the 140s to 150s. She has had a significant weight gain over the last few years after surgery on her ankle limiting her ability to exercise. She denies any chest pain, nausea, vomiting, abdominal pain, dizziness, vision changes, dysuria, fevers, chills or cough.  ED Course: Initial vitals showed temp 98, RR 17, HR 102, BP 193/144, SpO2 98% on room air.  Labs significant for sodium 136, K+ 3.4, blood glucose 127, creatinine 0.90, WBC 5.7, Hgb 10.3, D-dimer 1.64, BNP 271, negative pregnancy test, normal VBG, troponin 22->16.  EKG shows sinus rhythm with possible biatrial enlargement. CXR.  No acute cardiopulmonary disease.  CTA chest PE study negative for PE but shows  mild cardiomegaly.  Patient received IV labetalol  10 mg x 1 and amlodipine  10 mg x 1. [facility] was consulted for admission.  Review of Systems: Please see HPI for pertinent positives and negatives. A complete 10 system review of systems are otherwise negative.  Past Medical History:  Diagnosis Date   Asthma    Elevated blood pressure reading    Obesity    Past Surgical History:  Procedure Laterality Date   CYST EXCISION     KNEE SURGERY Left    ORIF ANKLE FRACTURE Right 04/28/2018   Procedure: OPEN REDUCTION INTERNAL FIXATION (ORIF) RIGHT ANKLE FRACTURE;  Surgeon: Osa Blase, MD;  Location: [facility] OR;  Service: Orthopedics;  Laterality: Right;   Social History:  reports that she quit smoking about 5 years ago. Her smoking use included cigarettes. She started smoking about 25 years ago. She has a 10 pack-year smoking history. She has never used smokeless tobacco. She reports current alcohol use. She reports that she does not use drugs.  Allergies  Allergen Reactions   Penicillins     Vaginal issue Has patient had a PCN reaction causing immediate rash, facial/tongue/throat swelling, SOB or lightheadedness with hypotension: No Has patient had a PCN reaction causing severe rash involving mucus membranes or skin necrosis: No Has patient had a PCN reaction that required hospitalization: No Has patient had a PCN reaction occurring within the last 10 years: No If all of the above answers are "NO", then may proceed with Cephalosporin use.     No family history on  file.   Prior to Admission medications   Medication Sig Start Date End Date Taking? Authorizing Provider  albuterol  (PROAIR  HFA) 108 (90 Base) MCG/ACT inhaler Inhale 2 puffs into the lungs every 6 (six) hours as needed for wheezing or shortness of breath. 09/25/20  Yes Simmons-Robinson, Makiera, MD  acetaminophen (TYLENOL) 500 MG tablet Take 1,000 mg by mouth daily as needed for headache (PAIN). Patient not taking: Reported on 07/09/2023     [provider]  albuterol  (PROVENTIL  HFA;VENTOLIN  HFA) 108 (90 Base) MCG/ACT inhaler Inhale 1-2 puffs into the lungs every 6 (six) hours as needed for wheezing or shortness of breath. Patient not taking: Reported on 07/09/2023 02/26/17   Street, [place], PA-C  amLODipine  (NORVASC ) 5 MG tablet Take 1 tablet (5 mg total) by mouth at bedtime. Patient not taking: Reported on 07/09/2023 09/25/20   Simmons-Robinson, Judyann Number, MD  Blood Pressure Monitoring (ADULT BLOOD PRESSURE CUFF LG) KIT 1 Units by Does not apply route daily. Patient not taking: Reported on 07/09/2023 09/25/20   Simmons-Robinson, Judyann Number, MD  chlorthalidone  (HYGROTON ) 25 MG tablet Take 1 tablet (25 mg total) by mouth daily. Patient not taking: Reported on 07/09/2023 10/15/20   Simmons-Robinson, Judyann Number, MD  DULoxetine  (CYMBALTA ) 30 MG capsule Take 1 capsule (30 mg total) by mouth daily. Patient not taking: Reported on 07/09/2023 10/15/20   Simmons-Robinson, Judyann Number, MD  ibuprofen  (ADVIL ) 600 MG tablet Take 1 tablet (600 mg total) by mouth every 8 (eight) hours as needed for cramping. Patient not taking: Reported on 07/09/2023 08/20/20   Simmons-Robinson, Judyann Number, MD  lidocaine  (XYLOCAINE ) 2 % solution Use as directed 10 mLs in the mouth or throat as needed for mouth pain. Patient not taking: Reported on 07/09/2023 09/07/20   Corbin Dess, PA-C  pseudoephedrine (SUDAFED) 120 MG 12 hr tablet Take 120 mg by mouth 2 (two) times daily. Patient not taking: Reported on 07/09/2023    [provider]  pseudoephedrine (SUDAFED) 30 MG tablet Take 30 mg by mouth every 4 (four) hours as needed for congestion. Patient not taking: Reported on 07/09/2023    [provider]  Multiple Vitamins-Calcium (ONE-A-DAY WOMENS PO) Take 1 tablet by mouth daily. Patient not taking: Reported on 09/25/2020    [provider]    Physical Exam: BP (!) 158/94   Pulse 80   Temp 98.4 F (36.9 C) (Oral)   Resp 18   Ht 5\' 5"  (1.651 m)    Wt 131.5 kg   LMP 06/16/2023   SpO2 98%   BMI 48.26 kg/m  General: Pleasant, well-appearing morbidly obese woman laying in bed. No acute distress. HEENT: [place]/AT. Anicteric sclera Neck: Supple. JVD difficult to assess due to body habitus CV: RRR. No murmurs, rubs, or gallops. No LE edema Pulmonary: Lungs CTAB. Normal effort. No wheezing or rales. Decreased air movement throughout. Abdominal: Soft, nontender, nondistended. Normal bowel sounds. Extremities: Palpable radial and DP pulses. Normal ROM. Skin: Warm and dry. No obvious rash or lesions. Neuro: A&Ox3. Moves all extremities. Normal sensation to light touch. No focal deficit. Psych: Normal mood and affect          Labs on Admission:  Basic Metabolic Panel: Recent Labs  Lab 07/09/23 1730  NA 136  K 3.4*  CL 104  CO2 24  GLUCOSE 127*  BUN 13  CREATININE 0.90  CALCIUM 8.5*   Liver Function Tests: Recent Labs  Lab 07/09/23 1730  AST 18  ALT 20  ALKPHOS 63  BILITOT 0.8  PROT 7.0  ALBUMIN 3.1*   No results for input(s): "LIPASE", "AMYLASE" in the last 168 hours. No results for input(s): "AMMONIA" in the last 168 hours. CBC: Recent Labs  Lab 07/09/23 1730  WBC 5.7  NEUTROABS 2.6  HGB 10.3*  HCT 34.3*  MCV 73.9*  PLT 277   Cardiac Enzymes: No results for input(s): "CKTOTAL", "CKMB", "CKMBINDEX", "TROPONINI" in the last 168 hours. BNP (last 3 results) Recent Labs    07/09/23 1730  BNP 271.1*    ProBNP (last 3 results) No results for input(s): "PROBNP" in the last 8760 hours.  CBG: No results for input(s): "GLUCAP" in the last 168 hours.  Radiological Exams on Admission: CT Angio Chest PE W/Cm &/Or Wo Cm Result Date: 07/09/2023 CLINICAL DATA:  Shortness of breath. EXAM: CT ANGIOGRAPHY CHEST WITH CONTRAST TECHNIQUE: Multidetector CT imaging of the chest was performed using the standard protocol during bolus administration of intravenous contrast. Multiplanar CT image reconstructions and MIPs were  obtained to evaluate the vascular anatomy. RADIATION DOSE REDUCTION: This exam was performed according to the departmental dose-optimization program which includes automated exposure control, adjustment of the mA and/or kV according to patient size and/or use of iterative reconstruction technique. CONTRAST:  80mL OMNIPAQUE IOHEXOL 350 MG/ML SOLN COMPARISON:  None Available. FINDINGS: Cardiovascular: Mild cardiomegaly. No evidence of aortic aneurysm or dissection. No filling defects in the pulmonary arteries to suggest pulmonary emboli. Mediastinum/Nodes: No mediastinal, hilar, or axillary adenopathy. Trachea and esophagus are unremarkable. Thyroid unremarkable. Lungs/Pleura: Lungs are clear. No focal airspace opacities or suspicious nodules. No effusions. Upper Abdomen: No acute findings Musculoskeletal: Chest wall soft tissues are unremarkable. No acute bony abnormality. Review of the MIP images confirms the above findings. IMPRESSION: No evidence of pulmonary embolus. Mild cardiomegaly. No acute cardiopulmonary disease. Electronically Signed   By: Janeece Mechanic M.D.   On: 07/09/2023 20:18   DG Chest 2 View Result Date: 07/09/2023 CLINICAL DATA:  Shortness of breath. EXAM: CHEST - 2 VIEW COMPARISON:  February 26, 2017. FINDINGS: Stable cardiomediastinal silhouette. Both lungs are clear. The visualized skeletal structures are unremarkable. IMPRESSION: No active cardiopulmonary disease. Electronically Signed   By: Rosalene Colon M.D.   On: 07/09/2023 18:04   Assessment/Plan Siedah DAVAE NORGREN is a 43 y.o. female with medical history significant for morbid obesity, hypertension and asthma who presented to the ED for evaluation of shortness of breath and admitted for hypertensive urgency.  # Hypertensive urgency # Severe hypertension - Obese middle-age woman presented with SOB/DOE found to have severely elevated BP with SBP in the 180s to 190s - This is in the setting of morbid obesity and patient being off BP  meds over the last year - No focal neurodeficits on exam and no chest pain - SBP improved to the 150s after IV labetalol  10 mg x 1 and amlodipine  10 mg in the ED - Start Coreg 12.5 mg twice daily, titrated for BP goal <130/80 - PRN labetalol  for SBP >180  # Shortness of breath # Dyspnea on exertion - Endorse increased SOB and dyspnea on exertion over the last week with associated PND and occasional ankle swelling - CT chest shows mild cardiomegaly, BNP elevated to 271 - Patient euvolemic on exam and satting well on room air - Diastolic dysfunction very likely in the setting of uncontrolled hypertension and morbid obesity - Check echocardiogram - Supplemental O2 as needed  # OSA - Morbidly obese patient with reported snoring, episodes of apnea during sleep, hypertension, unrefreshing sleep and fatigue during  the day - STOP-BANG score of 6, indicates HIGH risk for moderate to severe OSA - Trial of CPAP tonight - Follow-up with PCP for referral for sleep test  # Mild intermittent asthma - Reports intermittent wheezing and shortness of breath - Recently out of her nebulized solution - No significant wheezing or respiratory distress on exam. - Start ICS/LABA as daily maintenance therapy - As needed albuterol  nebs  # Hypokalemia - K+ 3.4 on admission.  - Repleting with KCl 40 mEq x 1 - Follow-up BMP, mag and Phos  # Microcytic anemia - Hgb of 10.3 on admission - No evidence of active bleed - Follow-up repeat CBC, iron studies, ferritin and vitamin B12  # Class III obesity Body mass index is 48.26 kg/m. Filed Weights   07/09/23 1641  Weight: 131.5 kg  - In the setting of her hypertension, concern for possible metabolic syndrome - Check TSH, lipid panel, A1c - Follow-up with PCP for nutrition and weight loss counseling   DVT prophylaxis: Lovenox     Code Status: Full Code  Consults called: None  Family Communication: Discussed admission with parents at  bedside  Severity of Illness: The appropriate patient status for this patient is OBSERVATION. Observation status is judged to be reasonable and necessary in order to provide the required intensity of service to ensure the patient's safety. The patient's presenting symptoms, physical exam findings, and initial radiographic and laboratory data in the context of their medical condition is felt to place them at decreased risk for further clinical deterioration. Furthermore, it is anticipated that the patient will be medically stable for discharge from the hospital within 2 midnights of admission.   Level of care: Telemetry   This record has been created using Conservation officer, historic buildings. Errors have been sought and corrected, but may not always be located. Such creation errors do not reflect on the standard of care.   Vita Grip, MD 07/09/2023, 11:35 PM [facility] Pager: [phone] Isaiah 41:10   If 7PM-7AM, please contact night-coverage [email or web address] Password [email or web address]

## 2023-07-09 NOTE — ED Provider Notes (Signed)
 Teresa Crane Provider Note   CSN: [id number] Arrival date & time: 07/09/23  1626     History  Chief Complaint  Patient presents with   Shortness of Breath    Teresa Crane is a 43 y.o. female.  HPI The patient reports that she has been having significant problems with shortness of breath for about a week.  She reports that she is getting very fatigued and short of breath with exertion.  She denies fever or productive cough.  She has not had calf pain or lower extremity swelling.  Patient reports that when she is falling asleep at night that she stopped breathing and has actually wet the bed because she has not awakened until she is wet herself.  She reports normally she gets up to go to the bathroom a few times during the night but lately since this shortness of breath and fatigue of sudden, she is not waking up.  Patient reports that she used to be on blood pressure medications but was having difficulty with problems with constipation and so she has not been on any blood pressure medications for a while.  She also reports that family members and other people have said they think she has sleep apnea but she has never actually had formal testing or been started on a CPAP.    Home Medications Prior to Admission medications   Medication Sig Start Date End Date Taking? Authorizing Provider  albuterol  (PROAIR  HFA) 108 (90 Base) MCG/ACT inhaler Inhale 2 puffs into the lungs every 6 (six) hours as needed for wheezing or shortness of breath. 09/25/20  Yes Simmons-Robinson, Makiera, MD  acetaminophen (TYLENOL) 500 MG tablet Take 1,000 mg by mouth daily as needed for headache (PAIN). Patient not taking: Reported on 07/09/2023    [provider]  albuterol  (PROVENTIL  HFA;VENTOLIN  HFA) 108 (90 Base) MCG/ACT inhaler Inhale 1-2 puffs into the lungs every 6 (six) hours as needed for wheezing or shortness of breath. Patient not taking: Reported on  07/09/2023 02/26/17   Street, [place], PA-C  amLODipine  (NORVASC ) 5 MG tablet Take 1 tablet (5 mg total) by mouth at bedtime. Patient not taking: Reported on 07/09/2023 09/25/20   Simmons-Robinson, Judyann Number, MD  Blood Pressure Monitoring (ADULT BLOOD PRESSURE CUFF LG) KIT 1 Units by Does not apply route daily. Patient not taking: Reported on 07/09/2023 09/25/20   Simmons-Robinson, Judyann Number, MD  chlorthalidone  (HYGROTON ) 25 MG tablet Take 1 tablet (25 mg total) by mouth daily. Patient not taking: Reported on 07/09/2023 10/15/20   Simmons-Robinson, Judyann Number, MD  DULoxetine  (CYMBALTA ) 30 MG capsule Take 1 capsule (30 mg total) by mouth daily. Patient not taking: Reported on 07/09/2023 10/15/20   Simmons-Robinson, Judyann Number, MD  ibuprofen  (ADVIL ) 600 MG tablet Take 1 tablet (600 mg total) by mouth every 8 (eight) hours as needed for cramping. Patient not taking: Reported on 07/09/2023 08/20/20   Simmons-Robinson, Judyann Number, MD  lidocaine  (XYLOCAINE ) 2 % solution Use as directed 10 mLs in the mouth or throat as needed for mouth pain. Patient not taking: Reported on 07/09/2023 09/07/20   Corbin Dess, PA-C  pseudoephedrine (SUDAFED) 120 MG 12 hr tablet Take 120 mg by mouth 2 (two) times daily. Patient not taking: Reported on 07/09/2023    [provider]  pseudoephedrine (SUDAFED) 30 MG tablet Take 30 mg by mouth every 4 (four) hours as needed for congestion. Patient not taking: Reported on 07/09/2023    [provider]  Multiple Vitamins-Calcium (ONE-A-DAY  WOMENS PO) Take 1 tablet by mouth daily. Patient not taking: Reported on 09/25/2020    [provider]      Allergies    Penicillins    Review of Systems   Review of Systems  Physical Exam Updated Vital Signs BP (!) 183/125   Pulse 90   Temp 98.4 F (36.9 C) (Oral)   Resp 18   Ht 5\' 5"  (1.651 m)   Wt 131.5 kg   LMP 06/16/2023   SpO2 97%   BMI 48.26 kg/m  Physical Exam Constitutional:      Comments: Patient is alert and  nontoxic.  No respiratory distress at rest.  HENT:     Head: Normocephalic and atraumatic.     Mouth/Throat:     Mouth: Mucous membranes are moist.     Pharynx: Oropharynx is clear.  Eyes:     Extraocular Movements: Extraocular movements intact.     Pupils: Pupils are equal, round, and reactive to light.  Cardiovascular:     Rate and Rhythm: Normal rate and regular rhythm.  Pulmonary:     Effort: Pulmonary effort is normal.     Breath sounds: Normal breath sounds.  Abdominal:     General: There is no distension.     Palpations: Abdomen is soft.     Tenderness: There is no abdominal tenderness. There is no guarding.  Musculoskeletal:        General: No swelling or tenderness. Normal range of motion.     Right lower leg: No edema.     Left lower leg: No edema.  Skin:    General: Skin is warm and dry.  Neurological:     General: No focal deficit present.     Mental Status: She is oriented to person, place, and time.     Motor: No weakness.     Coordination: Coordination normal.  Psychiatric:        Mood and Affect: Mood normal.     ED Results / Procedures / Treatments   Labs (all labs ordered are listed, but only abnormal results are displayed) Labs Reviewed  BRAIN NATRIURETIC PEPTIDE - Abnormal; Notable for the following components:      Result Value   B Natriuretic Peptide 271.1 (*)    All other components within normal limits  COMPREHENSIVE METABOLIC PANEL WITH GFR - Abnormal; Notable for the following components:   Potassium 3.4 (*)    Glucose, Bld 127 (*)    Calcium 8.5 (*)    Albumin 3.1 (*)    All other components within normal limits  CBC WITH DIFFERENTIAL/PLATELET - Abnormal; Notable for the following components:   Hemoglobin 10.3 (*)    HCT 34.3 (*)    MCV 73.9 (*)    MCH 22.2 (*)    RDW 20.1 (*)    All other components within normal limits  D-DIMER, QUANTITATIVE - Abnormal; Notable for the following components:   D-Dimer, Quant 1.64 (*)    All other  components within normal limits  TROPONIN I (HIGH SENSITIVITY) - Abnormal; Notable for the following components:   Troponin I (High Sensitivity) 22 (*)    All other components within normal limits  HCG, SERUM, QUALITATIVE  BLOOD GAS, VENOUS  URINALYSIS, ROUTINE W REFLEX MICROSCOPIC  TROPONIN I (HIGH SENSITIVITY)    EKG EKG Interpretation Date/Time:  Friday Jul 09 2023 16:35:31 EDT Ventricular Rate:  96 PR Interval:  132 QRS Duration:  91 QT Interval:  390 QTC Calculation: 493 R Axis:  83  Text Interpretation: Sinus rhythm Borderline repolarization abnormality Borderline prolonged QT interval no acute ischemic appearance. no old comparison Confirmed by Wynetta Heckle [phone]) on 07/09/2023 5:03:30 PM  Radiology CT Angio Chest PE W/Cm &/Or Wo Cm Result Date: 07/09/2023 CLINICAL DATA:  Shortness of breath. EXAM: CT ANGIOGRAPHY CHEST WITH CONTRAST TECHNIQUE: Multidetector CT imaging of the chest was performed using the standard protocol during bolus administration of intravenous contrast. Multiplanar CT image reconstructions and MIPs were obtained to evaluate the vascular anatomy. RADIATION DOSE REDUCTION: This exam was performed according to the departmental dose-optimization program which includes automated exposure control, adjustment of the mA and/or kV according to patient size and/or use of iterative reconstruction technique. CONTRAST:  80mL OMNIPAQUE IOHEXOL 350 MG/ML SOLN COMPARISON:  None Available. FINDINGS: Cardiovascular: Mild cardiomegaly. No evidence of aortic aneurysm or dissection. No filling defects in the pulmonary arteries to suggest pulmonary emboli. Mediastinum/Nodes: No mediastinal, hilar, or axillary adenopathy. Trachea and esophagus are unremarkable. Thyroid unremarkable. Lungs/Pleura: Lungs are clear. No focal airspace opacities or suspicious nodules. No effusions. Upper Abdomen: No acute findings Musculoskeletal: Chest wall soft tissues are unremarkable. No acute bony  abnormality. Review of the MIP images confirms the above findings. IMPRESSION: No evidence of pulmonary embolus. Mild cardiomegaly. No acute cardiopulmonary disease. Electronically Signed   By: Janeece Mechanic M.D.   On: 07/09/2023 20:18   DG Chest 2 View Result Date: 07/09/2023 CLINICAL DATA:  Shortness of breath. EXAM: CHEST - 2 VIEW COMPARISON:  February 26, 2017. FINDINGS: Stable cardiomediastinal silhouette. Both lungs are clear. The visualized skeletal structures are unremarkable. IMPRESSION: No active cardiopulmonary disease. Electronically Signed   By: Rosalene Colon M.D.   On: 07/09/2023 18:04    Procedures Procedures   CRITICAL CARE Performed by: Wynetta Heckle   Total critical care time: 30 minutes  Critical care time was exclusive of separately billable procedures and treating other patients.  Critical care was necessary to treat or prevent imminent or life-threatening deterioration.  Critical care was time spent personally by me on the following activities: development of treatment plan with patient and/or surrogate as well as nursing, discussions with consultants, evaluation of patient's response to treatment, examination of patient, obtaining history from patient or surrogate, ordering and performing treatments and interventions, ordering and review of laboratory studies, ordering and review of radiographic studies, pulse oximetry and re-evaluation of patient's condition.  Medications Ordered in ED Medications  labetalol  (NORMODYNE ) injection 10 mg (has no administration in time range)  iohexol (OMNIPAQUE) 350 MG/ML injection 80 mL (80 mLs Intravenous Contrast Given 07/09/23 2002)  labetalol  (NORMODYNE ) injection 10 mg (10 mg Intravenous Given 07/09/23 2141)  amLODipine  (NORVASC ) tablet 10 mg (10 mg Oral Given 07/09/23 2140)    ED Course/ Medical Decision Making/ A&P                                 Medical Decision Making Amount and/or Complexity of Data Reviewed Labs:  ordered. Radiology: ordered.  Risk Prescription drug management. Decision regarding hospitalization.  Patient presents as outlined she has had increasing shortness of breath for a week.  Differential diagnosis includes ACS\PE\CHF\hypertensive emergency.  Will proceed with broad diagnostic evaluation.  Patient is significantly hypertensive.  She has had untreated hypertension for a while.  EKG does not show acute ischemic pattern reviewed by myself.  Troponin 33, 16 BNP 271 GFR normal greater than 60 venous blood gas normal.  D-dimer 1.6.  White count 5.7.  H&H 10.3 and 34.  CT PE study interpreted by radiology no acute PE no dissection.  Patient reported she had a headache before she went to CT.  She had not initially had headache.  Patient had been treated a dose of labetalol  and Norvasc .  Upon returning she reports her headache has resolved.  She has no neurologic dysfunction.  At this time with combination of shortness of breath hypertensive urgency and headache now resolved, will plan to admit for ongoing treatment of uncontrolled hypertension.  Consult: Reviewed with [facility] for admission.        Final Clinical Impression(s) / ED Diagnoses Final diagnoses:  Hypertensive urgency  Dyspnea, unspecified type  Acute nonintractable headache, unspecified headache type    Rx / DC Orders ED Discharge Orders     None         Wynetta Heckle, MD 07/09/23 2249

## 2023-07-10 ENCOUNTER — Encounter (HOSPITAL_COMMUNITY): Payer: Self-pay | Admitting: Student

## 2023-07-10 ENCOUNTER — Observation Stay (HOSPITAL_BASED_OUTPATIENT_CLINIC_OR_DEPARTMENT_OTHER)

## 2023-07-10 DIAGNOSIS — R0600 Dyspnea, unspecified: Secondary | ICD-10-CM

## 2023-07-10 DIAGNOSIS — E66813 Obesity, class 3: Secondary | ICD-10-CM | POA: Diagnosis not present

## 2023-07-10 DIAGNOSIS — R7989 Other specified abnormal findings of blood chemistry: Secondary | ICD-10-CM | POA: Insufficient documentation

## 2023-07-10 DIAGNOSIS — R0602 Shortness of breath: Secondary | ICD-10-CM | POA: Diagnosis not present

## 2023-07-10 DIAGNOSIS — J4520 Mild intermittent asthma, uncomplicated: Secondary | ICD-10-CM | POA: Insufficient documentation

## 2023-07-10 DIAGNOSIS — G4733 Obstructive sleep apnea (adult) (pediatric): Secondary | ICD-10-CM

## 2023-07-10 DIAGNOSIS — J45909 Unspecified asthma, uncomplicated: Secondary | ICD-10-CM

## 2023-07-10 DIAGNOSIS — I5042 Chronic combined systolic (congestive) and diastolic (congestive) heart failure: Secondary | ICD-10-CM | POA: Insufficient documentation

## 2023-07-10 DIAGNOSIS — E876 Hypokalemia: Secondary | ICD-10-CM

## 2023-07-10 DIAGNOSIS — Z6841 Body Mass Index (BMI) 40.0 and over, adult: Secondary | ICD-10-CM

## 2023-07-10 DIAGNOSIS — D509 Iron deficiency anemia, unspecified: Secondary | ICD-10-CM | POA: Insufficient documentation

## 2023-07-10 DIAGNOSIS — I160 Hypertensive urgency: Secondary | ICD-10-CM | POA: Diagnosis not present

## 2023-07-10 LAB — IRON AND TIBC
Iron: 23 ug/dL — ABNORMAL LOW (ref 28–170)
Saturation Ratios: 5 % — ABNORMAL LOW (ref 10.4–31.8)
TIBC: 427 ug/dL (ref 250–450)
UIBC: 404 ug/dL

## 2023-07-10 LAB — BASIC METABOLIC PANEL WITH GFR
Anion gap: 8 (ref 5–15)
BUN: 15 mg/dL (ref 6–20)
CO2: 26 mmol/L (ref 22–32)
Calcium: 8.3 mg/dL — ABNORMAL LOW (ref 8.9–10.3)
Chloride: 105 mmol/L (ref 98–111)
Creatinine, Ser: 0.94 mg/dL (ref 0.44–1.00)
GFR, Estimated: >60 mL/min (ref >60)
Glucose, Bld: 129 mg/dL — ABNORMAL HIGH (ref 70–99)
Potassium: 3.8 mmol/L (ref 3.5–5.1)
Sodium: 139 mmol/L (ref 135–145)

## 2023-07-10 LAB — MAGNESIUM: Magnesium: 1.9 mg/dL (ref 1.7–2.4)

## 2023-07-10 LAB — LIPID PANEL
Cholesterol: 166 mg/dL (ref 0–200)
HDL: 53 mg/dL (ref >40)
LDL Cholesterol: 88 mg/dL (ref 0–99)
Total CHOL/HDL Ratio: 3.1 ratio
Triglycerides: 125 mg/dL (ref <150)
VLDL: 25 mg/dL (ref 0–40)

## 2023-07-10 LAB — VITAMIN B12: Vitamin B-12: 257 pg/mL (ref 180–914)

## 2023-07-10 LAB — CBC
HCT: 32.5 % — ABNORMAL LOW (ref 36.0–46.0)
Hemoglobin: 9.6 g/dL — ABNORMAL LOW (ref 12.0–15.0)
MCH: 22.3 pg — ABNORMAL LOW (ref 26.0–34.0)
MCHC: 29.5 g/dL — ABNORMAL LOW (ref 30.0–36.0)
MCV: 75.4 fL — ABNORMAL LOW (ref 80.0–100.0)
Platelets: 243 10*3/uL (ref 150–400)
RBC: 4.31 MIL/uL (ref 3.87–5.11)
RDW: 20.2 % — ABNORMAL HIGH (ref 11.5–15.5)
WBC: 6.2 10*3/uL (ref 4.0–10.5)
nRBC: 0 % (ref 0.0–0.2)

## 2023-07-10 LAB — ECHOCARDIOGRAM COMPLETE
Area-P 1/2: 3.75 cm2
Calc EF: 27.4 %
Height: 65 in
MV VTI: 1.68 cm2
S' Lateral: 5.1 cm
Single Plane A2C EF: 23.3 %
Single Plane A4C EF: 29.7 %
Weight: 4640 [oz_av]

## 2023-07-10 LAB — TSH: TSH: 2.276 u[IU]/mL (ref 0.350–4.500)

## 2023-07-10 LAB — PHOSPHORUS: Phosphorus: 3.5 mg/dL (ref 2.5–4.6)

## 2023-07-10 LAB — FERRITIN: Ferritin: 7 ng/mL — ABNORMAL LOW (ref 11–307)

## 2023-07-10 LAB — HEMOGLOBIN A1C
Hgb A1c MFr Bld: 5.8 % — ABNORMAL HIGH (ref 4.8–5.6)
Mean Plasma Glucose: 119.76 mg/dL

## 2023-07-10 MED ORDER — CARVEDILOL 12.5 MG PO TABS
12.5000 mg | ORAL_TABLET | Freq: Two times a day (BID) | ORAL | Status: DC
Start: 2023-07-10 — End: 2023-07-11
  Administered 2023-07-10 – 2023-07-11 (×3): 12.5 mg via ORAL
  Filled 2023-07-10 (×3): qty 1

## 2023-07-10 MED ORDER — POTASSIUM CHLORIDE CRYS ER 20 MEQ PO TBCR
40.0000 meq | EXTENDED_RELEASE_TABLET | Freq: Once | ORAL | Status: AC
  Administered 2023-07-10: 40 meq via ORAL
  Filled 2023-07-10: qty 2

## 2023-07-10 MED ORDER — FERROUS SULFATE 325 (65 FE) MG PO TABS
325.0000 mg | ORAL_TABLET | Freq: Every day | ORAL | Status: DC
  Administered 2023-07-11: 325 mg via ORAL
  Filled 2023-07-10: qty 1

## 2023-07-10 MED ORDER — PERFLUTREN LIPID MICROSPHERE
1.0000 mL | INTRAVENOUS | Status: AC | PRN
  Administered 2023-07-10: 2 mL via INTRAVENOUS

## 2023-07-10 MED ORDER — LABETALOL HCL 5 MG/ML IV SOLN: 10.0000 mg | Freq: Four times a day (QID) | INTRAVENOUS | Status: DC | PRN

## 2023-07-10 NOTE — Assessment & Plan Note (Signed)
 We will counsel patient on lifestyle modification daily

## 2023-07-10 NOTE — Assessment & Plan Note (Signed)
 Downtrending in the setting of improving blood pressures

## 2023-07-10 NOTE — Assessment & Plan Note (Signed)
 No evidence of exacerbation at this time As needed albuterol  for bouts of shortness of breath and hypoxia

## 2023-07-10 NOTE — Progress Notes (Signed)
 Patient able to complete a lap around the halls at a steady pace without any SOB. O2 sats maintained 95-99%.

## 2023-07-10 NOTE — Progress Notes (Signed)
  Echocardiogram 2D Echocardiogram has been performed.  Teresa Crane 07/10/2023, 3:29 PM

## 2023-07-10 NOTE — Assessment & Plan Note (Signed)
 Significant longstanding symptoms of dyspnea on exertion Echocardiogram identifying global hypokinesis with ejection fraction of 30 to 35% with grade 2 diastolic dysfunction. 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  Will additionally touch base with cardiology.

## 2023-07-10 NOTE — Progress Notes (Signed)
 Pt placed on CPAP at this time per MD order. Pt is tolerating well at this time. No distress noted. Will continue to monitor pt.     07/10/23 0055  BiPAP/CPAP/SIPAP  $ Non-Invasive Home Ventilator  Initial  $ Face Mask Medium Yes  BiPAP/CPAP/SIPAP Pt Type Adult  BiPAP/CPAP/SIPAP Resmed  Mask Type Full face mask  Dentures removed? Not applicable  Mask Size Medium  FiO2 (%) 21 %  Patient Home Machine No  Patient Home Mask No  Patient Home Tubing No  Auto Titrate Yes  CPAP/SIPAP surface wiped down Yes  Device Plugged into RED Power Outlet Yes  BiPAP/CPAP /SiPAP Vitals  Pulse Rate 86  Resp 20  SpO2 100 %  MEWS Score/Color  MEWS Score 0  MEWS Score Color Marrie Sizer

## 2023-07-10 NOTE — Hospital Course (Addendum)
 43 y.o. female with past medical medical history significant for morbid obesity, hypertension and asthma who presented to the ED for evaluation of shortness of breath.    Upon evaluation in the emergency department patient was found to be quite hypertensive with diastolic blood pressures as high as the 140s.  Troponin was found to be slightly elevated at 22.  Due to clinical concerns for hypertensive emergency the hospitalist group was then called to assess the patient for admission of the hospital.  Patient was initially treated with several rounds of intravenous antihypertensives.  Patient was initiated on a scheduled regimen of oral antihypertensive simultaneously.   Troponins down trended as patient's blood pressure improved.  Echocardiogram was ordered and revealed a left regular ejection fraction of 30 to 35% with grade 2 diastolic dysfunction, global hypokinesis and an elevated estimated right ventricular systolic pressure of 45.5 mmHg.  Over the course of the hospitalization patient was titrated on multiple medications.  Initially patient was placed on Coreg  and once 25% reduction in patient's blood pressure was achieved for greater than 24 hours Entresto  was additionally added.  Considering the new depressed ejection fraction patient was thought to be suffering from hypertensive cardiomyopathy exacerbated by likely undiagnosed obstructive sleep apnea.  Case was discussed with cardiology and arranges were made for the patient to follow-up closely as an outpatient with Dr. Micael Adas with cardiology on 5/9.  Patient was discharged home in improved and stable condition on 07/11/2023.

## 2023-07-10 NOTE — Assessment & Plan Note (Signed)
 Iron panel consistent with iron deficiency anemia Initiating ferrous sulfate supplementation every morning prior to breakfast with a cup of orange juice

## 2023-07-10 NOTE — Assessment & Plan Note (Signed)
 Replaced

## 2023-07-10 NOTE — Assessment & Plan Note (Signed)
 Patient reports severe snoring, observed periods of apnea and daytime somnolence concerning for sleep apnea Will need polysomnogram in the outpatient setting Will continue CPAP nightly ordered by admitting provider

## 2023-07-10 NOTE — Progress Notes (Signed)
 PROGRESS NOTE   Teresa Crane  LKG:[id number] DOB: Nov 21, 1980 DOA: 07/09/2023 PCP: Wilhemena Harbour, MD   Date of Service: the patient was seen and examined on 07/10/2023  Brief Narrative:  43 y.o. female with past medical medical history significant for morbid obesity, hypertension and asthma who presented to the ED for evaluation of shortness of breath.    Upon evaluation in the emergency department patient was found to be quite hypertensive with diastolic blood pressures as high as the 140s.  Troponin was found to be slightly elevated at 22.  Due to clinical concerns for hypertensive emergency the hospitalist group was then called to assess the patient for admission of the hospital.  Patient was initially treated with several rounds of intravenous antihypertensives.  Patient was initiated on a scheduled regimen of oral antihypertensive simultaneously.   Troponins down trended as patient's blood pressure improved.  Echocardiogram was ordered.      Assessment & Plan Hypertensive urgency Patient presented with shortness of breath elevated troponin and markedly elevated blood pressures consistent with hypertensive urgency/emergency Blood pressure has come down at least 25% since initial presentation with initiation of Coreg.  Associated symptoms are also improving. Troponin is downtrending Considering new identification of depressed ejection fraction on echocardiogram, will consider initiation of Entresto tomorrow morning Continue to monitor on telemetry Chronic combined systolic and diastolic congestive heart failure (HCC) Significant longstanding symptoms of dyspnea on exertion Echocardiogram identifying global hypokinesis with ejection fraction of 30 to 35% with grade 2 diastolic dysfunction. 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  Will  additionally touch base with cardiology. Class 3 severe obesity due to excess calories with serious comorbidity and body mass index (BMI) of 45.0 to 49.9 in adult We will counsel patient on lifestyle modification daily Obstructive sleep apnea Patient reports severe snoring, observed periods of apnea and daytime somnolence concerning for sleep apnea Will need polysomnogram in the outpatient setting Will continue CPAP nightly ordered by admitting provider Iron deficiency anemia Iron panel consistent with iron deficiency anemia Initiating ferrous sulfate supplementation every morning prior to breakfast with a cup of orange juice Elevated troponin level not due myocardial infarction Downtrending in the setting of improving blood pressures Hypokalemia Replaced Mild intermittent asthma without complication No evidence of exacerbation at this time As needed albuterol  for bouts of shortness of breath and hypoxia     Subjective:  Patient reports improvement in shortness of breath compared to when she came in.  Patient currently denies chest pain but does still complain of dyspnea on exertion.  Physical Exam:  Vitals:   07/10/23 1424 07/10/23 1823 07/10/23 1831 07/10/23 2107  BP:  (!) 142/90  (!) 152/92  Pulse:  88  80  Resp:  18  20  Temp:  98.2 F (36.8 C)  97.7 F (36.5 C)  TempSrc:  Oral  Oral  SpO2: 99% 94% 95% 98%  Weight:      Height:         Constitutional: Awake alert and oriented x3, no associated distress.  Patient is obese. Skin: no rashes, no lesions, good skin turgor noted. Eyes: Pupils are equally reactive to light.  No evidence of scleral icterus or conjunctival pallor.  ENMT: Moist mucous membranes noted.  Posterior pharynx clear of any exudate or lesions.   Respiratory: clear to auscultation bilaterally, no wheezing, no crackles.  Scattered rhonchi noted.  Normal respiratory effort.  No accessory muscle use.  Cardiovascular: Regular rate and rhythm, no murmurs /  rubs / gallops. No extremity edema. 2+ pedal pulses. No carotid bruits.  Abdomen: Abdomen is but soft and nontender.  No evidence of intra-abdominal masses.  Positive bowel sounds noted in all quadrants.   Musculoskeletal: No joint deformity upper and lower extremities. Good ROM, no contractures. Normal muscle tone.    Data Reviewed:  I have personally reviewed and interpreted labs, imaging.  Significant findings are   CBC: Recent Labs  Lab 07/09/23 1730 07/10/23 0438  WBC 5.7 6.2  NEUTROABS 2.6  --   HGB 10.3* 9.6*  HCT 34.3* 32.5*  MCV 73.9* 75.4*  PLT 277 243   Basic Metabolic Panel: Recent Labs  Lab 07/09/23 1730 07/10/23 0438  NA 136 139  K 3.4* 3.8  CL 104 105  CO2 24 26  GLUCOSE 127* 129*  BUN 13 15  CREATININE 0.90 0.94  CALCIUM 8.5* 8.3*  MG  --  1.9  PHOS  --  3.5   GFR: Estimated Creatinine Clearance: 104.7 mL/min (by C-G formula based on SCr of 0.94 mg/dL). Liver Function Tests: Recent Labs  Lab 07/09/23 1730  AST 18  ALT 20  ALKPHOS 63  BILITOT 0.8  PROT 7.0  ALBUMIN 3.1*    Telemetry: Personally reviewed.  Rhythm is normal sinus rhythm with heart rate of 80 bpm.  No dynamic ST segment changes appreciated.   Code Status:  Full code.  Code status decision has been confirmed with: patient Family Communication: Multiple family members are at the bedside including parents   Severity of Illness:  The appropriate patient status for this patient is OBSERVATION. Observation status is judged to be reasonable and necessary in order to provide the required intensity of service to ensure the patient's safety. The patient's presenting symptoms, physical exam findings, and initial radiographic and laboratory data in the context of their medical condition is felt to place them at decreased risk for further clinical deterioration. Furthermore, it is anticipated that the patient will be medically stable for discharge from the hospital within 2 midnights of  admission.   Time spent:  54 minutes  Author:  True Fuss MD  07/10/2023 11:46 PM

## 2023-07-10 NOTE — Assessment & Plan Note (Signed)
 Patient presented with shortness of breath elevated troponin and markedly elevated blood pressures consistent with hypertensive urgency/emergency Blood pressure has come down at least 25% since initial presentation with initiation of Coreg.  Associated symptoms are also improving. Troponin is downtrending Considering new identification of depressed ejection fraction on echocardiogram, will consider initiation of Entresto tomorrow morning Continue to monitor on telemetry

## 2023-07-11 ENCOUNTER — Encounter: Payer: Self-pay | Admitting: Cardiology

## 2023-07-11 DIAGNOSIS — I5042 Chronic combined systolic (congestive) and diastolic (congestive) heart failure: Secondary | ICD-10-CM | POA: Diagnosis not present

## 2023-07-11 DIAGNOSIS — I160 Hypertensive urgency: Secondary | ICD-10-CM | POA: Diagnosis not present

## 2023-07-11 DIAGNOSIS — E66813 Obesity, class 3: Secondary | ICD-10-CM | POA: Diagnosis not present

## 2023-07-11 DIAGNOSIS — R7989 Other specified abnormal findings of blood chemistry: Secondary | ICD-10-CM | POA: Diagnosis not present

## 2023-07-11 LAB — COMPREHENSIVE METABOLIC PANEL WITH GFR
ALT: 18 U/L (ref 0–44)
AST: 15 U/L (ref 15–41)
Albumin: 2.9 g/dL — ABNORMAL LOW (ref 3.5–5.0)
Alkaline Phosphatase: 56 U/L (ref 38–126)
Anion gap: 7 (ref 5–15)
BUN: 17 mg/dL (ref 6–20)
CO2: 24 mmol/L (ref 22–32)
Calcium: 8.3 mg/dL — ABNORMAL LOW (ref 8.9–10.3)
Chloride: 106 mmol/L (ref 98–111)
Creatinine, Ser: 0.84 mg/dL (ref 0.44–1.00)
GFR, Estimated: >60 mL/min (ref >60)
Glucose, Bld: 122 mg/dL — ABNORMAL HIGH (ref 70–99)
Potassium: 3.8 mmol/L (ref 3.5–5.1)
Sodium: 137 mmol/L (ref 135–145)
Total Bilirubin: 0.2 mg/dL (ref 0.0–1.2)
Total Protein: 6.4 g/dL — ABNORMAL LOW (ref 6.5–8.1)

## 2023-07-11 LAB — CBC WITH DIFFERENTIAL/PLATELET
Abs Immature Granulocytes: 0.01 10*3/uL (ref 0.00–0.07)
Basophils Absolute: 0 10*3/uL (ref 0.0–0.1)
Basophils Relative: 1 %
Eosinophils Absolute: 0.3 10*3/uL (ref 0.0–0.5)
Eosinophils Relative: 5 %
HCT: 33.1 % — ABNORMAL LOW (ref 36.0–46.0)
Hemoglobin: 9.8 g/dL — ABNORMAL LOW (ref 12.0–15.0)
Immature Granulocytes: 0 %
Lymphocytes Relative: 42 %
Lymphs Abs: 2.6 10*3/uL (ref 0.7–4.0)
MCH: 22.1 pg — ABNORMAL LOW (ref 26.0–34.0)
MCHC: 29.6 g/dL — ABNORMAL LOW (ref 30.0–36.0)
MCV: 74.5 fL — ABNORMAL LOW (ref 80.0–100.0)
Monocytes Absolute: 0.6 10*3/uL (ref 0.1–1.0)
Monocytes Relative: 10 %
Neutro Abs: 2.5 10*3/uL (ref 1.7–7.7)
Neutrophils Relative %: 42 %
Platelets: 250 10*3/uL (ref 150–400)
RBC: 4.44 MIL/uL (ref 3.87–5.11)
RDW: 19.9 % — ABNORMAL HIGH (ref 11.5–15.5)
WBC: 6.1 10*3/uL (ref 4.0–10.5)
nRBC: 0 % (ref 0.0–0.2)

## 2023-07-11 LAB — HIV ANTIBODY (ROUTINE TESTING W REFLEX): HIV Screen 4th Generation wRfx: NONREACTIVE

## 2023-07-11 LAB — MAGNESIUM: Magnesium: 1.9 mg/dL (ref 1.7–2.4)

## 2023-07-11 MED ORDER — SACUBITRIL-VALSARTAN 24-26 MG PO TABS
1.0000 | ORAL_TABLET | Freq: Two times a day (BID) | ORAL | Status: DC
  Administered 2023-07-11: 1 via ORAL
  Filled 2023-07-11 (×2): qty 1

## 2023-07-11 MED ORDER — FERROUS SULFATE 325 (65 FE) MG PO TABS: 325.0000 mg | ORAL_TABLET | Freq: Every day | ORAL | 2 refills | Status: AC

## 2023-07-11 MED ORDER — CARVEDILOL 12.5 MG PO TABS: 12.5000 mg | ORAL_TABLET | Freq: Two times a day (BID) | ORAL | 2 refills | Status: DC

## 2023-07-11 MED ORDER — SACUBITRIL-VALSARTAN 24-26 MG PO TABS: 1.0000 | ORAL_TABLET | Freq: Two times a day (BID) | ORAL | 2 refills | Status: DC

## 2023-07-11 NOTE — Assessment & Plan Note (Signed)
 We will counsel patient on lifestyle modification daily

## 2023-07-11 NOTE — Discharge Instructions (Addendum)
 Please take all prescribed medications exactly as instructed including your new prescriptions for Entresto and Coreg.  He also have been identified to be iron deficient and therefore have been placed on iron supplements. Please consume a low sodium diet Please increase your physical activity as tolerated. Please maintain all outpatient follow-up appointments including follow-up with your primary care provider and Cardiology as detailed in the after visit summary. Inquire with your primary care provider a referral for sleep study to test for sleep apnea. Please return to the emergency department if you develop worsening shortness of breath, chest pain, rapid weight gain or weakness.

## 2023-07-11 NOTE — Assessment & Plan Note (Signed)
 No evidence of exacerbation at this time As needed albuterol  for bouts of shortness of breath and hypoxia

## 2023-07-11 NOTE — Assessment & Plan Note (Signed)
 Significant longstanding symptoms of dyspnea on exertion Echocardiogram identifying global hypokinesis with ejection fraction of 30 to 35% with grade 2 diastolic dysfunction. 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  Will additionally touch base with cardiology.

## 2023-07-11 NOTE — Assessment & Plan Note (Signed)
 Downtrending in the setting of improving blood pressures

## 2023-07-11 NOTE — Assessment & Plan Note (Signed)
 Patient presented with shortness of breath elevated troponin and markedly elevated blood pressures consistent with hypertensive urgency/emergency Blood pressure has come down at least 25% since initial presentation with initiation of Coreg.  Associated symptoms are also improving. Troponin is downtrending Considering new identification of depressed ejection fraction on echocardiogram, will consider initiation of Entresto tomorrow morning Continue to monitor on telemetry

## 2023-07-11 NOTE — Plan of Care (Signed)
 ?  Problem: Clinical Measurements: ?Goal: Ability to maintain clinical measurements within normal limits will improve ?Outcome: Progressing ?Goal: Will remain free from infection ?Outcome: Progressing ?Goal: Diagnostic test results will improve ?Outcome: Progressing ?  ?

## 2023-07-11 NOTE — Progress Notes (Incomplete)
 PROGRESS NOTE   Teresa Crane  UXL:[id number] DOB: June 05, 1980 DOA: 07/09/2023 PCP: Wilhemena Harbour, MD   Date of Service: the patient was seen and examined on 07/11/2023  Brief Narrative:  43 y.o. female with past medical medical history significant for morbid obesity, hypertension and asthma who presented to the ED for evaluation of shortness of breath.    Upon evaluation in the emergency department patient was found to be quite hypertensive with diastolic blood pressures as high as the 140s.  Troponin was found to be slightly elevated at 22.  Due to clinical concerns for hypertensive emergency the hospitalist group was then called to assess the patient for admission of the hospital.  Patient was initially treated with several rounds of intravenous antihypertensives.  Patient was initiated on a scheduled regimen of oral antihypertensive simultaneously.   Troponins down trended as patient's blood pressure improved.  Echocardiogram was ordered.      Assessment & Plan Hypertensive urgency Patient presented with shortness of breath elevated troponin and markedly elevated blood pressures consistent with hypertensive urgency/emergency Blood pressure has come down at least 25% since initial presentation with initiation of Coreg.  Associated symptoms are also improving. Troponin is downtrending Considering new identification of depressed ejection fraction on echocardiogram, will consider initiation of Entresto tomorrow morning Continue to monitor on telemetry Chronic combined systolic and diastolic congestive heart failure (HCC) Significant longstanding symptoms of dyspnea on exertion Echocardiogram identifying global hypokinesis with ejection fraction of 30 to 35% with grade 2 diastolic dysfunction. 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  Will  additionally touch base with cardiology. Class 3 severe obesity due to excess calories with serious comorbidity and body mass index (BMI) of 45.0 to 49.9 in adult We will counsel patient on lifestyle modification daily Obstructive sleep apnea Patient reports severe snoring, observed periods of apnea and daytime somnolence concerning for sleep apnea Will need polysomnogram in the outpatient setting Will continue CPAP nightly ordered by admitting provider Iron deficiency anemia Iron panel consistent with iron deficiency anemia Initiating ferrous sulfate supplementation every morning prior to breakfast with a cup of orange juice Elevated troponin level not due myocardial infarction Downtrending in the setting of improving blood pressures Hypokalemia Replaced Mild intermittent asthma without complication No evidence of exacerbation at this time As needed albuterol  for bouts of shortness of breath and hypoxia Dyspnea  Chronic asthma      Subjective:  Patient reports improvement in shortness of breath compared to when she came in.  Patient currently denies chest pain but does still complain of dyspnea on exertion.  Physical Exam:  Vitals:   07/11/23 0400 07/11/23 0500 07/11/23 0600 07/11/23 0900  BP:   (!) 154/87   Pulse:   89   Resp: 15 16 (!) 24 (!) 27  Temp:   98.5 F (36.9 C)   TempSrc:   Oral   SpO2:   96%   Weight:      Height:         Constitutional: Awake alert and oriented x3, no associated distress.  Patient is obese. Skin: no rashes, no lesions, good skin turgor noted. Eyes: Pupils are equally reactive to light.  No evidence of scleral icterus or conjunctival pallor.  ENMT: Moist mucous membranes noted.  Posterior pharynx clear of any exudate or lesions.   Respiratory: clear to auscultation bilaterally, no wheezing, no crackles.  Scattered rhonchi noted.  Normal respiratory effort. No accessory muscle use.  Cardiovascular: Regular rate and rhythm, no murmurs /  rubs / gallops. No extremity edema. 2+ pedal pulses. No carotid bruits.  Abdomen: Abdomen is but soft and nontender.  No evidence of intra-abdominal masses.  Positive bowel sounds noted in all quadrants.   Musculoskeletal: No joint deformity upper and lower extremities. Good ROM, no contractures. Normal muscle tone.    Data Reviewed:  I have personally reviewed and interpreted labs, imaging.  Significant findings are   CBC: Recent Labs  Lab 07/09/23 1730 07/10/23 0438 07/11/23 0523  WBC 5.7 6.2 6.1  NEUTROABS 2.6  --  2.5  HGB 10.3* 9.6* 9.8*  HCT 34.3* 32.5* 33.1*  MCV 73.9* 75.4* 74.5*  PLT 277 243 250   Basic Metabolic Panel: Recent Labs  Lab 07/09/23 1730 07/10/23 0438 07/11/23 0523  NA 136 139 137  K 3.4* 3.8 3.8  CL 104 105 106  CO2 24 26 24   GLUCOSE 127* 129* 122*  BUN 13 15 17   CREATININE 0.90 0.94 0.84  CALCIUM 8.5* 8.3* 8.3*  MG  --  1.9 1.9  PHOS  --  3.5  --    GFR: Estimated Creatinine Clearance: 117.1 mL/min (by C-G formula based on SCr of 0.84 mg/dL). Liver Function Tests: Recent Labs  Lab 07/09/23 1730 07/11/23 0523  AST 18 15  ALT 20 18  ALKPHOS 63 56  BILITOT 0.8 0.2  PROT 7.0 6.4*  ALBUMIN 3.1* 2.9*    Telemetry: Personally reviewed.  Rhythm is normal sinus rhythm with heart rate of 80 bpm.  No dynamic ST segment changes appreciated.   Code Status:  Full code.  Code status decision has been confirmed with: patient Family Communication: Multiple family members are at the bedside including parents   Severity of Illness:  The appropriate patient status for this patient is OBSERVATION. Observation status is judged to be reasonable and necessary in order to provide the required intensity of service to ensure the patient's safety. The patient's presenting symptoms, physical exam findings, and initial radiographic and laboratory data in the context of their medical condition is felt to place them at decreased risk for further clinical  deterioration. Furthermore, it is anticipated that the patient will be medically stable for discharge from the hospital within 2 midnights of admission.   Time spent:  54 minutes  Author:  True Fuss MD  07/11/2023 9:40 AM

## 2023-07-11 NOTE — Plan of Care (Signed)
  Problem: Education: Goal: Knowledge of General Education information will improve Description: Including pain rating scale, medication(s)/side effects and non-pharmacologic comfort measures Outcome: Progressing   Problem: Health Behavior/Discharge Planning: Goal: Ability to manage health-related needs will improve Outcome: Progressing   Problem: Clinical Measurements: Goal: Ability to maintain clinical measurements within normal limits will improve 07/11/2023 0617 by Prudy Brownie, RN Outcome: Progressing 07/11/2023 0243 by Prudy Brownie, RN Outcome: Progressing Goal: Will remain free from infection Outcome: Progressing Goal: Diagnostic test results will improve Outcome: Progressing

## 2023-07-11 NOTE — Assessment & Plan Note (Signed)
 Patient reports severe snoring, observed periods of apnea and daytime somnolence concerning for sleep apnea Will need polysomnogram in the outpatient setting Will continue CPAP nightly ordered by admitting provider

## 2023-07-11 NOTE — Assessment & Plan Note (Signed)
 Replaced

## 2023-07-11 NOTE — Assessment & Plan Note (Signed)
 Iron panel consistent with iron deficiency anemia Initiating ferrous sulfate supplementation every morning prior to breakfast with a cup of orange juice

## 2023-07-16 ENCOUNTER — Encounter: Payer: Self-pay | Admitting: Cardiology

## 2023-07-16 ENCOUNTER — Ambulatory Visit: Attending: Cardiology | Admitting: Cardiology

## 2023-07-16 VITALS — BP 158/98 | HR 89 | Ht 65.0 in | Wt 317.6 lb

## 2023-07-16 DIAGNOSIS — I5043 Acute on chronic combined systolic (congestive) and diastolic (congestive) heart failure: Secondary | ICD-10-CM | POA: Diagnosis present

## 2023-07-16 DIAGNOSIS — I272 Pulmonary hypertension, unspecified: Secondary | ICD-10-CM | POA: Diagnosis present

## 2023-07-16 DIAGNOSIS — I420 Dilated cardiomyopathy: Secondary | ICD-10-CM | POA: Insufficient documentation

## 2023-07-16 DIAGNOSIS — G4733 Obstructive sleep apnea (adult) (pediatric): Secondary | ICD-10-CM | POA: Insufficient documentation

## 2023-07-16 DIAGNOSIS — I160 Hypertensive urgency: Secondary | ICD-10-CM | POA: Insufficient documentation

## 2023-07-16 DIAGNOSIS — R0609 Other forms of dyspnea: Secondary | ICD-10-CM | POA: Insufficient documentation

## 2023-07-16 MED ORDER — SPIRONOLACTONE 25 MG PO TABS: 12.5000 mg | ORAL_TABLET | Freq: Every day | ORAL | 3 refills | Status: DC

## 2023-07-16 MED ORDER — METOPROLOL TARTRATE 100 MG PO TABS: 100.0000 mg | ORAL_TABLET | Freq: Once | ORAL | 0 refills | Status: AC

## 2023-07-16 MED ORDER — ENTRESTO 49-51 MG PO TABS
1.0000 | ORAL_TABLET | Freq: Two times a day (BID) | ORAL | 5 refills | Status: DC
Start: 2023-07-16 — End: 2023-08-06

## 2023-07-16 NOTE — Patient Instructions (Addendum)
 Medication Instructions:  Your physician has recommended you make the following change in your medication:  1.) increase Entresto  to 49-51 mg - take one tablet twice a day 2.) start spironolactone 12.5 mg - take one tablet daily  *If you need a refill on your cardiac medications before your next appointment, please call your pharmacy*  Lab Work: Today: first floor lab:  sed rate, crp, ana, rheumatoid factor Return in one week (May 19) for more blood work (bmet)  Testing/Procedures: Your physician has recommended that you have a pulmonary function test. Pulmonary Function Tests are a group of tests that measure how well air moves in and out of your lungs. ECHO is to be done IN 2 MONTHS Your physician has requested that you have an echocardiogram. Echocardiography is a painless test that uses sound waves to create images of your heart. It provides your doctor with information about the size and shape of your heart and how well your heart's chambers and valves are working. This procedure takes approximately one hour. There are no restrictions for this procedure. Please do NOT wear cologne, perfume, aftershave, or lotions (deodorant is allowed). Please arrive 15 minutes prior to your appointment time.  PERFUSION SCAN (perfusion study - assesses how well blood flows to the lungs)  Coronary CT Angiogram - see instructions below  Your physician has recommended that you have a sleep study. This test records several body functions during sleep, including: brain activity, eye movement, oxygen and carbon dioxide blood levels, heart rate and rhythm, breathing rate and rhythm, the flow of air through your mouth and nose, snoring, body muscle movements, and chest and belly movement.   Follow-Up: 1 week lab visit and nurse visit for blood pressure check (schedule 07/26/23) 3 weeks with Dr. Micael Adas, PharmD, or an Advanced Practice Provider 2 months with Dr. Micael Adas after the echo     Your cardiac CT  will be scheduled at one of the below locations:   [facility] [address] [place], [place] [address] [phone]   OR   [facility] Mom. [facility] [address]  [place], [place] [address] Opening July 05, 2023  If scheduled at [facility], please arrive at the [facility] and Children's Entrance (Entrance C2) of [facility] 30 minutes prior to test start time. You can use the FREE valet parking offered at entrance C (encouraged to control the heart rate for the test)  Proceed to the [facility] Radiology Department (first floor) to check-in and test prep.   All radiology patients and guests should use entrance C2 at [facility], accessed from [facility], even though the hospital's physical address listed is [address]    If scheduled at the Heart and Vascular Tower at [address], please enter the parking lot using the [address] entrance and use the FREE valet service at the patient drop-off area. Enter the buidling and check-in with registration on the main floor.  Please follow these instructions carefully (unless otherwise directed):  An IV will be required for this test and Nitroglycerin will be given.   On the Night Before the Test: Be sure to Drink plenty of water. Do not consume any caffeinated/decaffeinated beverages or chocolate 12 hours prior to your test. Do not take any antihistamines 12 hours prior to your test.  On the Day of the Test: Drink plenty of water until 1 hour prior to the test. Do not eat any food 1  hour prior to test. You may take your regular medications prior to the test.  Take metoprolol (Lopressor) two hours prior to test. Hold Spironolactone on the morning of the test. Patients who wear a continuous glucose monitor MUST remove the device prior to scanning. FEMALES- please wear underwire-free bra if available, avoid dresses & tight clothing  After the  Test: Drink plenty of water. After receiving IV contrast, you may experience a mild flushed feeling. This is normal. On occasion, you may experience a mild rash up to 24 hours after the test. This is not dangerous. If this occurs, you can take Benadryl 25 mg, Zyrtec, Claritin, or Allegra and increase your fluid intake. (Patients taking Tikosyn should avoid Benadryl, and may take Zyrtec, Claritin, or Allegra) If you experience trouble breathing, this can be serious. If it is severe call 911 IMMEDIATELY. If it is mild, please call our office.  We will call to schedule your test 2-4 weeks out understanding that some insurance companies will need an authorization prior to the service being performed.   For more information and frequently asked questions, please visit our website : [email or web address]  For non-scheduling related questions, please contact the cardiac imaging nurse navigator should you have any questions/concerns: Cardiac Imaging Nurse Navigators Direct Office Dial: [phone]   For scheduling needs, including cancellations and rescheduling, please call [place], [phone].

## 2023-07-16 NOTE — Progress Notes (Signed)
 Cardiology CONSULT Note    Date:  07/16/2023   ID:  Teresa Crane, DOB 22-Jul-1980, MRN [id number]  PCP:  Wilhemena Harbour, MD  Cardiologist:  Gaylyn Keas, MD   Chief Complaint  Patient presents with   New Patient (Initial Visit)    Dilated cardiomyopathy, acute on chronic combined systolic/diastolic CHF, pulmonary hypertension, hypertensive urgency    Patient Profile: Teresa Crane is a 43 y.o. female who is being seen today for the evaluation of shortness of breath, hypertensive urgency, severe hypertension and obstructive sleep apnea.  At the request of Wilhemena Harbour, MD.  History of Present Illness:  Teresa Crane is a 43 y.o. female who is being seen today for the evaluation of hypertensive urgency/severe hypertension, shortness of breath and obstructive sleep apnea at the request of Wilhemena Harbour, MD.  This is a 43 year old female with a her story of asthma, obesity, hypertension who recently was hospitalized 07/09/2023 with hypertensive urgency with systolic blood pressure in the 180s to 190s when she presented with severe shortness of breath.  She has a history of morbid obesity and hypertension had been off blood pressure medicines for the past year.  She was given IV labetalol  10 mg x 1 and started on amlodipine  10 mg in the ER as well as carvedilol  12.5 mg twice daily.  BNP was elevated to 71 and chest CT showed mild cardiomegaly but no PE..  2D echo 07/10/2023 showed EF 30 to 35% with global HK and grade 2 diastolic dysfunction, moderate pulmonary hypertension, mild MR and mild mitral stenosis, moderate TR.  She was discharged home from the ER and set up for follow-up for cardiac consultation in our office today.  She tells me that she has had HTN for several years and was seen by multiple MDs and started on BP meds that she had a lot of side effects from and stopped taking meds. She tells me that for a few weeks she was having SOB that she thought was related to pollen  and triggering her asthma but then started having difficulty breathing in her sleep.  She has been told that snores badly in her sleep and has been told that she stops breathing in her sleep.  She has noticed some light chest pressure along with a fluttering in her chest.  The pressure occurs when she feels SOB and occurs mainly with exertion. No radiation of the chest discomfort.  She has had some diaphoresis with the SOB and chest pressure. She has noticed that if her BP gets high her legs will swell.  She also has been having some palpitations.  Her work is physical and notices it more with exertion.   Past Medical History:  Diagnosis Date   Asthma    Elevated blood pressure reading    Elevated troponin level not due myocardial infarction 07/10/2023   Obesity     Past Surgical History:  Procedure Laterality Date   CYST EXCISION     KNEE SURGERY Left    ORIF ANKLE FRACTURE Right 04/28/2018   Procedure: OPEN REDUCTION INTERNAL FIXATION (ORIF) RIGHT ANKLE FRACTURE;  Surgeon: Osa Blase, MD;  Location: [facility] OR;  Service: Orthopedics;  Laterality: Right;    Current Medications: Current Meds  Medication Sig   albuterol  (PROAIR  HFA) 108 (90 Base) MCG/ACT inhaler Inhale 2 puffs into the lungs every 6 (six) hours as needed for wheezing or shortness of breath.   carvedilol  (COREG ) 12.5 MG tablet Take 1 tablet (12.5  mg total) by mouth 2 (two) times daily with a meal.   ferrous sulfate  325 (65 FE) MG tablet Take 1 tablet (325 mg total) by mouth daily before breakfast.   sacubitril -valsartan  (ENTRESTO ) 24-26 MG Take 1 tablet by mouth 2 (two) times daily.    Allergies:   Penicillins   Social History   Socioeconomic History   Marital status: Single    Spouse name: Not on file   Number of children: Not on file   Years of education: Not on file   Highest education level: Not on file  Occupational History   Not on file  Tobacco Use   Smoking status: Former    Current packs/day: 0.00     Average packs/day: 0.5 packs/day for 20.0 years (10.0 ttl pk-yrs)    Types: Cigarettes    Start date: 05/22/1998    Quit date: 05/22/2018    Years since quitting: 5.1   Smokeless tobacco: Never  Vaping Use   Vaping status: Not on file  Substance and Sexual Activity   Alcohol use: Yes    Comment: social   Drug use: No   Sexual activity: Not on file  Other Topics Concern   Not on file  Social History Narrative   Not on file   Social Drivers of Health   Financial Resource Strain: Not on file  Food Insecurity: No Food Insecurity (07/10/2023)   Hunger Vital Sign    Worried About Running Out of Food in the Last Year: Never true    Ran Out of Food in the Last Year: Never true  Transportation Needs: No Transportation Needs (07/10/2023)   PRAPARE - Administrator, Civil Service (Medical): No    Lack of Transportation (Non-Medical): No  Physical Activity: Not on file  Stress: Not on file  Social Connections: Not on file     Family History:  The patient's family history is not on file.   ROS:   Please see the history of present illness.    ROS All other systems reviewed and are negative.     07/12/2023    2:37 PM  PAD Screen  Previous PAD dx? No  Previous surgical procedure? No  Pain with walking? Yes  Subsides with rest? Yes  Feet/toe relief with dangling? No  Painful, non-healing ulcers? No  Extremities discolored? No       PHYSICAL EXAM:   VS:  BP (!) 158/98   Pulse 89   Ht 5\' 5"  (1.651 m)   Wt (!) 317 lb 9.6 oz (144.1 kg)   LMP 06/16/2023   SpO2 98%   BMI 52.85 kg/m    GEN: Well nourished, well developed, in no acute distress  HEENT: normal  Neck: no JVD, carotid bruits, or masses Cardiac: RRR; no murmurs, rubs, or gallops,no edema.  Intact distal pulses bilaterally.  Respiratory:  clear to auscultation bilaterally, normal work of breathing GI: soft, nontender, nondistended, + BS MS: no deformity or atrophy  Skin: warm and dry, no rash Neuro:   Alert and Oriented x 3, Strength and sensation are intact Psych: euthymic mood, full affect  Wt Readings from Last 3 Encounters:  07/16/23 (!) 317 lb 9.6 oz (144.1 kg)  07/09/23 290 lb (131.5 kg)  10/15/20 (!) 320 lb 9.6 oz (145.4 kg)      Studies/Labs Reviewed:       Cardiac Studies & Procedures   ______________________________________________________________________________________________     ECHOCARDIOGRAM  ECHOCARDIOGRAM COMPLETE 07/10/2023  Narrative ECHOCARDIOGRAM REPORT  Patient Name:   Teresa Crane Date of Exam: 07/10/2023 Medical Rec #:  [id number]        Height:       65.0 in Accession #:    [id number]       Weight:       290.0 lb Date of Birth:  03/07/1981         BSA:          2.317 m Patient Age:    43 years         BP:           134/58 mmHg Patient Gender: F                HR:           80 bpm. Exam Location:  Inpatient  Procedure: 2D Echo, Cardiac Doppler, Color Doppler and Intracardiac Opacification Agent (Both Spectral and Color Flow Doppler were utilized during procedure).  Indications:    R06.02 SOB  History:        Patient has no prior history of Echocardiogram examinations. Signs/Symptoms:Shortness of Breath and Dyspnea; Risk Factors:Hypertension and Sleep Apnea.  Sonographer:    Raynelle Callow RDCS Referring Phys: [id number] Annette Killings [facility]   Sonographer Comments: Technically difficult study due to poor echo windows and patient is obese. Image acquisition challenging due to patient body habitus. IMPRESSIONS   1. Left ventricular ejection fraction, by estimation, is 30 to 35%. The left ventricle has moderately decreased function. The left ventricle demonstrates global hypokinesis. The left ventricular internal cavity size was severely dilated. Left ventricular diastolic parameters are consistent with Grade II diastolic dysfunction (pseudonormalization). Elevated left ventricular end-diastolic pressure. 2. Right ventricular systolic function is  normal. The right ventricular size is normal. There is moderately elevated pulmonary artery systolic pressure. 3. Left atrial size was moderately dilated. 4. The mitral valve is degenerative. Mild mitral valve regurgitation. Mild mitral stenosis. 5. Tricuspid valve regurgitation is moderate. 6. The aortic valve is tricuspid. There is mild calcification of the aortic valve. Aortic valve regurgitation is trivial. Aortic valve sclerosis is present, with no evidence of aortic valve stenosis.  Comparison(s): No prior Echocardiogram.  FINDINGS Left Ventricle: Left ventricular ejection fraction, by estimation, is 30 to 35%. The left ventricle has moderately decreased function. The left ventricle demonstrates global hypokinesis. Definity  contrast agent was given IV to delineate the left ventricular endocardial borders. The left ventricular internal cavity size was severely dilated. There is no left ventricular hypertrophy. Left ventricular diastolic parameters are consistent with Grade II diastolic dysfunction (pseudonormalization). Elevated left ventricular end-diastolic pressure.  Right Ventricle: The right ventricular size is normal. No increase in right ventricular wall thickness. Right ventricular systolic function is normal. There is moderately elevated pulmonary artery systolic pressure. The tricuspid regurgitant velocity is 2.76 m/s, and with an assumed right atrial pressure of 15 mmHg, the estimated right ventricular systolic pressure is 45.5 mmHg.  Left Atrium: Left atrial size was moderately dilated.  Right Atrium: Right atrial size was normal in size.  Pericardium: There is no evidence of pericardial effusion.  Mitral Valve: The mitral valve is degenerative in appearance. Mild mitral valve regurgitation. Mild mitral valve stenosis. MV peak gradient, 13.0 mmHg. The mean mitral valve gradient is 5.0 mmHg.  Tricuspid Valve: The tricuspid valve is normal in structure. Tricuspid valve  regurgitation is moderate . No evidence of tricuspid stenosis.  Aortic Valve: The aortic valve is tricuspid. There is mild calcification of the aortic valve. Aortic valve regurgitation  is trivial. Aortic valve sclerosis is present, with no evidence of aortic valve stenosis.  Pulmonic Valve: The pulmonic valve was normal in structure. Pulmonic valve regurgitation is mild. No evidence of pulmonic stenosis.  Aorta: The aortic root and ascending aorta are structurally normal, with no evidence of dilitation.  IAS/Shunts: No atrial level shunt detected by color flow Doppler.  Additional Comments: 3D was performed not requiring image post processing on an independent workstation and was indeterminate.   LEFT VENTRICLE PLAX 2D LVIDd:         6.10 cm      Diastology LVIDs:         5.10 cm      LV e' medial:    4.35 cm/s LV PW:         1.20 cm      LV E/e' medial:  34.6 LV IVS:        0.95 cm      LV e' lateral:   4.35 cm/s LVOT diam:     2.00 cm      LV E/e' lateral: 34.6 LV SV:         64 LV SV Index:   28 LVOT Area:     3.14 cm  LV Volumes (MOD) LV vol d, MOD A2C: 172.0 ml LV vol d, MOD A4C: 192.0 ml LV vol s, MOD A2C: 132.0 ml LV vol s, MOD A4C: 135.0 ml LV SV MOD A2C:     40.0 ml LV SV MOD A4C:     192.0 ml LV SV MOD BP:      50.1 ml  RIGHT VENTRICLE             IVC RV S prime:     11.70 cm/s  IVC diam: 2.50 cm TAPSE (M-mode): 2.2 cm  LEFT ATRIUM              Index        RIGHT ATRIUM           Index LA diam:        5.00 cm  2.16 cm/m   RA Area:     16.70 cm LA Vol (A2C):   103.0 ml 44.46 ml/m  RA Volume:   41.60 ml  17.96 ml/m LA Vol (A4C):   91.2 ml  39.37 ml/m LA Biplane Vol: 97.9 ml  42.26 ml/m AORTIC VALVE LVOT Vmax:   114.00 cm/s LVOT Vmean:  72.200 cm/s LVOT VTI:    0.204 m  AORTA Ao Root diam: 2.60 cm Ao Asc diam:  3.30 cm  MITRAL VALVE                TRICUSPID VALVE MV Area (PHT): 3.75 cm     TR Peak grad:   30.5 mmHg MV Area VTI:   1.68 cm     TR  Vmax:        276.00 cm/s MV Peak grad:  13.0 mmHg MV Mean grad:  5.0 mmHg     SHUNTS MV Vmax:       1.80 m/s     Systemic VTI:  0.20 m MV Vmean:      108.0 cm/s   Systemic Diam: 2.00 cm MV Decel Time: 203 msec MV E velocity: 150.50 cm/s MV A velocity: 121.50 cm/s MV E/A ratio:  1.24  Janelle Mediate MD Electronically signed by Janelle Mediate MD Signature Date/Time: 07/10/2023/5:05:14 PM    Final          ______________________________________________________________________________________________  Recent Labs: 07/09/2023: B Natriuretic Peptide 271.1 07/10/2023: TSH 2.276 07/11/2023: ALT 18; BUN 17; Creatinine, Ser 0.84; Hemoglobin 9.8; Magnesium 1.9; Platelets 250; Potassium 3.8; Sodium 137   Lipid Panel    Component Value Date/Time   CHOL 166 07/10/2023 0438   CHOL 184 08/20/2020 1738   TRIG 125 07/10/2023 0438   HDL 53 07/10/2023 0438   HDL 56 08/20/2020 1738   CHOLHDL 3.1 07/10/2023 0438   VLDL 25 07/10/2023 0438   LDLCALC 88 07/10/2023 0438   LDLCALC 112 (H) 08/20/2020 1738     Additional studies/ records that were reviewed today include:  Hospital records from 07/27/2023 including 2D echo and chest CT    ASSESSMENT:    1. Dyspnea on exertion   2. Hypertensive urgency   3. Dilated cardiomyopathy (HCC)   4. Acute on chronic combined systolic and diastolic CHF (congestive heart failure) (HCC)   5. Pulmonary hypertension, unspecified (HCC)   6. OSA (obstructive sleep apnea)      PLAN:  In order of problems listed above:  #Dyspnea on exertion #Hypertensive urgency #Dilated cardiomyopathy #Acute combined systolic/diastolic CHF #Pulmonary hypertension #Probable obstructive sleep apnea #Mild mitral stenosis - Admitted with dyspnea on exertion in the setting of morbid obesity, hypertensive urgency and medical noncompliance with blood pressure medicines for the past year - Systolic BP was in the 180s - Started on Entresto  24-26 mg twice daily and carvedilol   12.5 mg twice daily  - Chest CT showed cardiomegaly but no evidence of PE and no mention of coronary artery calcifications - Serum creatinine was normal at 0.84 with potassium 3.8 TSH 2.24 - 2D echo personally reviewed showing EF 30 to 35% with global HK, G2 DD,moderate pulmonary hypertension, mild MR and mild mitral stenosis, moderate TR.  - Suspect that her new LV dysfunction is related to hypertensive cardiomyopathy - Pulmonary hypertension likely related to a combination of group 2 (pulmonary venous hypertension) and group 3 (untreated OSA) - Will check for other causes of pulmonary hypertension including: PFTs with DLCO Split-night sleep study ESR, CRP, ANA, rheumatoid factor VQ scan to rule out PE  - Check coronary CTA to assess for CAD - In regards to newly diagnosed cardiomyopathy, she is currently on carvedilol  12.5 mg twice daily, Entresto  24-26 mg twice daily which she was started in the ER - BP remains elevated - Increase Entresto  to 49-51 mg twice daily - Start spironolactone 12.5 mg daily - Will avoid addition of SGLT2 at this time given her morbid obesity and increased risk of UTI - Follow-up nurse visit in 1 week for BP check - Follow-up in 2 to 3 weeks with extender or CHF Pharm.D. for up titration of GDMT - Follow-up with me in 2 months  Time Spent: 20 minutes total time of encounter, including 15 minutes spent in face-to-face patient care on the date of this encounter. This time includes coordination of care and counseling regarding above mentioned problem list. Remainder of non-face-to-face time involved reviewing chart documents/testing relevant to the patient encounter and documentation in the medical record. I have independently reviewed documentation from referring provider  Followup:  2 to 3 weeks with the extender and 2 months with me  Medication Adjustments/Labs and Tests Ordered: Current medicines are reviewed at length with the patient today.  Concerns  regarding medicines are outlined above.  Medication changes, Labs and Tests ordered today are listed in the Patient Instructions below.  There are no Patient Instructions on file for this visit.   Signed,  Gaylyn Keas, MD  07/16/2023 3:12 PM    [facility] [address] [place], [place], [place]  11/08/1980 Phone: [phone]; Fax: [phone]

## 2023-07-16 NOTE — Addendum Note (Signed)
 Addended by: Amaree Loisel on: 07/16/2023 03:56 PM   Modules accepted: Orders

## 2023-07-17 LAB — RHEUMATOID FACTOR: Rheumatoid fact SerPl-aCnc: 11.3 [IU]/mL (ref <14.0)

## 2023-07-17 LAB — C-REACTIVE PROTEIN: CRP: 12 mg/L — ABNORMAL HIGH (ref 0–10)

## 2023-07-20 NOTE — Discharge Summary (Signed)
 Physician Discharge Summary   Patient: Teresa Crane MRN: [id number] DOB: 1980-06-09  Admit date:     07/09/2023  Discharge date: 07/11/2023  Discharge Physician: Teresa Crane   PCP: Teresa Harbour, MD   Recommendations at discharge:    Please take all prescribed medications exactly as instructed including your new prescriptions for Entresto  and Coreg .  He also have been identified to be iron deficient and therefore have been placed on iron supplements. Please consume a low sodium diet Please increase your physical activity as tolerated. Please maintain all outpatient follow-up appointments including follow-up with your primary care provider and Cardiology as detailed in the after visit summary. Inquire with your primary care provider a referral for sleep study to test for sleep apnea. Please return to the emergency department if you develop worsening shortness of breath, chest pain, rapid weight gain or weakness.  Discharge Diagnoses: Principal Problem:   Hypertensive urgency Active Problems:   Class 3 severe obesity due to excess calories with serious comorbidity and body mass index (BMI) of 45.0 to 49.9 in adult   Dyspnea   Iron deficiency anemia   Obstructive sleep apnea   Hypokalemia   Chronic asthma   Chronic combined systolic and diastolic congestive heart failure (HCC)   Mild intermittent asthma without complication   Elevated troponin level not due myocardial infarction  Resolved Problems:   * No resolved hospital problems. *   Hospital Course: 43 y.o. female with past medical medical history significant for morbid obesity, hypertension and asthma who presented to the ED for evaluation of shortness of breath.    Upon evaluation in the emergency department patient was found to be quite hypertensive with diastolic blood pressures as high as the 140s.  Troponin was found to be slightly elevated at 22.  Due to clinical concerns for hypertensive emergency the  hospitalist group was then called to assess the patient for admission of the hospital.  Patient was initially treated with several rounds of intravenous antihypertensives.  Patient was initiated on a scheduled regimen of oral antihypertensive simultaneously.   Troponins down trended as patient's blood pressure improved.  Echocardiogram was ordered and revealed a left regular ejection fraction of 30 to 35% with grade 2 diastolic dysfunction, global hypokinesis and an elevated estimated right ventricular systolic pressure of 45.5 mmHg.  Over the course of the hospitalization patient was titrated on multiple medications.  Initially patient was placed on Coreg  and once 25% reduction in patient's blood pressure was achieved for greater than 24 hours Entresto  was additionally added.  Considering the new depressed ejection fraction patient was thought to be suffering from hypertensive cardiomyopathy exacerbated by likely undiagnosed obstructive sleep apnea.  Case was discussed with cardiology and arranges were made for the patient to follow-up closely as an outpatient with Dr. Micael Crane with cardiology on 5/9.  Patient was discharged home in improved and stable condition on 07/11/2023.       Consultants: none Procedures performed: none Disposition: Home Diet recommendation:  Discharge Diet Orders (From admission, onward)     Start     Ordered   07/11/23 0000  Diet - low sodium heart healthy        07/11/23 1349           Cardiac diet  DISCHARGE MEDICATION: Allergies as of 07/11/2023       Reactions   Penicillins    Vaginal issue Has patient had a PCN reaction causing immediate rash, facial/tongue/throat swelling, SOB or lightheadedness with hypotension:  No Has patient had a PCN reaction causing severe rash involving mucus membranes or skin necrosis: No Has patient had a PCN reaction that required hospitalization: No Has patient had a PCN reaction occurring within the last 10 years: No If  all of the above answers are "NO", then may proceed with Cephalosporin use.        Medication List     STOP taking these medications    acetaminophen  500 MG tablet Commonly known as: TYLENOL    amLODipine  5 MG tablet Commonly known as: NORVASC    chlorthalidone  25 MG tablet Commonly known as: HYGROTON    ibuprofen  600 MG tablet Commonly known as: ADVIL    lidocaine  2 % solution Commonly known as: XYLOCAINE    pseudoephedrine 120 MG 12 hr tablet Commonly known as: SUDAFED   pseudoephedrine 30 MG tablet Commonly known as: SUDAFED       TAKE these medications    Adult Blood Pressure Cuff Lg Kit 1 Units by Does not apply route daily.   albuterol  108 (90 Base) MCG/ACT inhaler Commonly known as: VENTOLIN  HFA Inhale 1-2 puffs into the lungs every 6 (six) hours as needed for wheezing or shortness of breath.   albuterol  108 (90 Base) MCG/ACT inhaler Commonly known as: ProAir  HFA Inhale 2 puffs into the lungs every 6 (six) hours as needed for wheezing or shortness of breath.   carvedilol  12.5 MG tablet Commonly known as: COREG  Take 1 tablet (12.5 mg total) by mouth 2 (two) times daily with a meal.   ferrous sulfate  325 (65 FE) MG tablet Take 1 tablet (325 mg total) by mouth daily before breakfast.        Follow-up Information     Teresa Matsu, MD Follow up on 07/16/2023.   Specialty: Cardiology Why: Cardiology Follow-up on 07/16/2023 at 2:40 PM. Contact information: [address] [place] [address] [phone]         Teresa Harbour, MD Follow up in 1 week(s).   Specialty: Family Medicine Contact information: [address] [place] [address] [phone]                 Discharge Exam: Teresa Crane Weights   07/09/23 1641  Weight: 131.5 kg    Constitutional: Awake alert and oriented x3, no associated distress.   Respiratory: clear to auscultation bilaterally, no wheezing, no crackles. Normal respiratory effort. No accessory muscle  use.  Cardiovascular: Regular rate and rhythm, no murmurs / rubs / gallops. No extremity edema. 2+ pedal pulses. No carotid bruits.  Abdomen: Abdomen is soft and nontender.  No evidence of intra-abdominal masses.  Positive bowel sounds noted in all quadrants.   Musculoskeletal: No joint deformity upper and lower extremities. Good ROM, no contractures. Normal muscle tone.     Condition at discharge: fair  The results of significant diagnostics from this hospitalization (including imaging, microbiology, ancillary and laboratory) are listed below for reference.   Imaging Studies: ECHOCARDIOGRAM COMPLETE Result Date: 07/10/2023    ECHOCARDIOGRAM REPORT   Patient Name:   Teresa Crane Date of Exam: 07/10/2023 Medical Rec #:  [id number]        Height:       65.0 in Accession #:    [id number]       Weight:       290.0 lb Date of Birth:  1980/06/30         BSA:          2.317 m Patient Age:    43 years  BP:           134/58 mmHg Patient Gender: F                HR:           80 bpm. Exam Location:  Inpatient Procedure: 2D Echo, Cardiac Doppler, Color Doppler and Intracardiac            Opacification Agent (Both Spectral and Color Flow Doppler were            utilized during procedure). Indications:    R06.02 SOB  History:        Patient has no prior history of Echocardiogram examinations.                 Signs/Symptoms:Shortness of Breath and Dyspnea; Risk                 Factors:Hypertension and Sleep Apnea.  Sonographer:    Raynelle Callow RDCS Referring Phys: [id number] Annette Killings [facility]  Sonographer Comments: Technically difficult study due to poor echo windows and patient is obese. Image acquisition challenging due to patient body habitus. IMPRESSIONS  1. Left ventricular ejection fraction, by estimation, is 30 to 35%. The left ventricle has moderately decreased function. The left ventricle demonstrates global hypokinesis. The left ventricular internal cavity size was severely dilated. Left ventricular  diastolic parameters are consistent with Grade II diastolic dysfunction (pseudonormalization). Elevated left ventricular end-diastolic pressure.  2. Right ventricular systolic function is normal. The right ventricular size is normal. There is moderately elevated pulmonary artery systolic pressure.  3. Left atrial size was moderately dilated.  4. The mitral valve is degenerative. Mild mitral valve regurgitation. Mild mitral stenosis.  5. Tricuspid valve regurgitation is moderate.  6. The aortic valve is tricuspid. There is mild calcification of the aortic valve. Aortic valve regurgitation is trivial. Aortic valve sclerosis is present, with no evidence of aortic valve stenosis. Comparison(s): No prior Echocardiogram. FINDINGS  Left Ventricle: Left ventricular ejection fraction, by estimation, is 30 to 35%. The left ventricle has moderately decreased function. The left ventricle demonstrates global hypokinesis. Definity  contrast agent was given IV to delineate the left ventricular endocardial borders. The left ventricular internal cavity size was severely dilated. There is no left ventricular hypertrophy. Left ventricular diastolic parameters are consistent with Grade II diastolic dysfunction (pseudonormalization). Elevated left ventricular end-diastolic pressure. Right Ventricle: The right ventricular size is normal. No increase in right ventricular wall thickness. Right ventricular systolic function is normal. There is moderately elevated pulmonary artery systolic pressure. The tricuspid regurgitant velocity is 2.76 m/s, and with an assumed right atrial pressure of 15 mmHg, the estimated right ventricular systolic pressure is 45.5 mmHg. Left Atrium: Left atrial size was moderately dilated. Right Atrium: Right atrial size was normal in size. Pericardium: There is no evidence of pericardial effusion. Mitral Valve: The mitral valve is degenerative in appearance. Mild mitral valve regurgitation. Mild mitral valve  stenosis. MV peak gradient, 13.0 mmHg. The mean mitral valve gradient is 5.0 mmHg. Tricuspid Valve: The tricuspid valve is normal in structure. Tricuspid valve regurgitation is moderate . No evidence of tricuspid stenosis. Aortic Valve: The aortic valve is tricuspid. There is mild calcification of the aortic valve. Aortic valve regurgitation is trivial. Aortic valve sclerosis is present, with no evidence of aortic valve stenosis. Pulmonic Valve: The pulmonic valve was normal in structure. Pulmonic valve regurgitation is mild. No evidence of pulmonic stenosis. Aorta: The aortic root and ascending aorta are structurally normal, with no evidence  of dilitation. IAS/Shunts: No atrial level shunt detected by color flow Doppler. Additional Comments: 3D was performed not requiring image post processing on an independent workstation and was indeterminate.  LEFT VENTRICLE PLAX 2D LVIDd:         6.10 cm      Diastology LVIDs:         5.10 cm      LV e' medial:    4.35 cm/s LV PW:         1.20 cm      LV E/e' medial:  34.6 LV IVS:        0.95 cm      LV e' lateral:   4.35 cm/s LVOT diam:     2.00 cm      LV E/e' lateral: 34.6 LV SV:         64 LV SV Index:   28 LVOT Area:     3.14 cm  LV Volumes (MOD) LV vol d, MOD A2C: 172.0 ml LV vol d, MOD A4C: 192.0 ml LV vol s, MOD A2C: 132.0 ml LV vol s, MOD A4C: 135.0 ml LV SV MOD A2C:     40.0 ml LV SV MOD A4C:     192.0 ml LV SV MOD BP:      50.1 ml RIGHT VENTRICLE             IVC RV S prime:     11.70 cm/s  IVC diam: 2.50 cm TAPSE (M-mode): 2.2 cm LEFT ATRIUM              Index        RIGHT ATRIUM           Index LA diam:        5.00 cm  2.16 cm/m   RA Area:     16.70 cm LA Vol (A2C):   103.0 ml 44.46 ml/m  RA Volume:   41.60 ml  17.96 ml/m LA Vol (A4C):   91.2 ml  39.37 ml/m LA Biplane Vol: 97.9 ml  42.26 ml/m  AORTIC VALVE LVOT Vmax:   114.00 cm/s LVOT Vmean:  72.200 cm/s LVOT VTI:    0.204 m  AORTA Ao Root diam: 2.60 cm Ao Asc diam:  3.30 cm MITRAL VALVE                 TRICUSPID VALVE MV Area (PHT): 3.75 cm     TR Peak grad:   30.5 mmHg MV Area VTI:   1.68 cm     TR Vmax:        276.00 cm/s MV Peak grad:  13.0 mmHg MV Mean grad:  5.0 mmHg     SHUNTS MV Vmax:       1.80 m/s     Systemic VTI:  0.20 m MV Vmean:      108.0 cm/s   Systemic Diam: 2.00 cm MV Decel Time: 203 msec MV E velocity: 150.50 cm/s MV A velocity: 121.50 cm/s MV E/A ratio:  1.24 Janelle Mediate MD Electronically signed by Janelle Mediate MD Signature Date/Time: 07/10/2023/5:05:14 PM    Final    CT Angio Chest PE W/Cm &/Or Wo Cm Result Date: 07/09/2023 CLINICAL DATA:  Shortness of breath. EXAM: CT ANGIOGRAPHY CHEST WITH CONTRAST TECHNIQUE: Multidetector CT imaging of the chest was performed using the standard protocol during bolus administration of intravenous contrast. Multiplanar CT image reconstructions and MIPs were obtained to evaluate the vascular anatomy. RADIATION DOSE REDUCTION: This exam was performed  according to the departmental dose-optimization program which includes automated exposure control, adjustment of the mA and/or kV according to patient size and/or use of iterative reconstruction technique. CONTRAST:  80mL OMNIPAQUE  IOHEXOL  350 MG/ML SOLN COMPARISON:  None Available. FINDINGS: Cardiovascular: Mild cardiomegaly. No evidence of aortic aneurysm or dissection. No filling defects in the pulmonary arteries to suggest pulmonary emboli. Mediastinum/Nodes: No mediastinal, hilar, or axillary adenopathy. Trachea and esophagus are unremarkable. Thyroid unremarkable. Lungs/Pleura: Lungs are clear. No focal airspace opacities or suspicious nodules. No effusions. Upper Abdomen: No acute findings Musculoskeletal: Chest wall soft tissues are unremarkable. No acute bony abnormality. Review of the MIP images confirms the above findings. IMPRESSION: No evidence of pulmonary embolus. Mild cardiomegaly. No acute cardiopulmonary disease. Electronically Signed   By: Janeece Mechanic M.D.   On: 07/09/2023 20:18   DG Chest 2  View Result Date: 07/09/2023 CLINICAL DATA:  Shortness of breath. EXAM: CHEST - 2 VIEW COMPARISON:  February 26, 2017. FINDINGS: Stable cardiomediastinal silhouette. Both lungs are clear. The visualized skeletal structures are unremarkable. IMPRESSION: No active cardiopulmonary disease. Electronically Signed   By: Rosalene Colon M.D.   On: 07/09/2023 18:04    Microbiology: No results found for this or any previous visit.  Labs: CBC: No results for input(s): "WBC", "NEUTROABS", "HGB", "HCT", "MCV", "PLT" in the last 168 hours. Basic Metabolic Panel: No results for input(s): "NA", "K", "CL", "CO2", "GLUCOSE", "BUN", "CREATININE", "CALCIUM", "MG", "PHOS" in the last 168 hours. Liver Function Tests: No results for input(s): "AST", "ALT", "ALKPHOS", "BILITOT", "PROT", "ALBUMIN" in the last 168 hours. CBG: No results for input(s): "GLUCAP" in the last 168 hours.  Discharge time spent: greater than 30 minutes.  Signed: True Fuss, MD [facility] 07/20/2023

## 2023-07-22 ENCOUNTER — Ambulatory Visit: Payer: Self-pay

## 2023-07-22 DIAGNOSIS — I5042 Chronic combined systolic (congestive) and diastolic (congestive) heart failure: Secondary | ICD-10-CM

## 2023-07-22 DIAGNOSIS — R0600 Dyspnea, unspecified: Secondary | ICD-10-CM

## 2023-07-22 DIAGNOSIS — Z79899 Other long term (current) drug therapy: Secondary | ICD-10-CM

## 2023-07-22 NOTE — Telephone Encounter (Signed)
 Call to patient to explain that CRP is elevated but rheumatoid factor is normal.  Patient verbalizes understanding, explained Dr. Micael Adas would also like a sed rate. Patient agrees to plan, orders placed.

## 2023-07-22 NOTE — Telephone Encounter (Signed)
-----   Message from Gaylyn Keas sent at 07/19/2023  4:36 PM EDT ----- CRP is elevated but rheumatoid factor is normal.  She was supposed to have had a sed rate but I do not see that this was done can you please get this ordered

## 2023-07-23 ENCOUNTER — Ambulatory Visit (HOSPITAL_COMMUNITY)
Admission: RE | Admit: 2023-07-23 | Discharge: 2023-07-23 | Disposition: A | Discharge status: Home / Self Care | Source: Ambulatory Visit | Attending: Cardiology | Admitting: Cardiology

## 2023-07-23 DIAGNOSIS — R0609 Other forms of dyspnea: Secondary | ICD-10-CM | POA: Diagnosis present

## 2023-07-23 LAB — PULMONARY FUNCTION TEST
DL/VA % pred: 112 %
DL/VA: 4.91 ml/min/mmHg/L
DLCO cor % pred: 73 %
DLCO cor: 16.39 ml/min/mmHg
DLCO unc % pred: 63 %
DLCO unc: 14.24 ml/min/mmHg
FEF 25-75 Post: 3.07 L/s
FEF 25-75 Pre: 3.64 L/s
FEF2575-%Change-Post: -15 %
FEF2575-%Pred-Post: 100 %
FEF2575-%Pred-Pre: 119 %
FEV1-%Change-Post: -6 %
FEV1-%Pred-Post: 70 %
FEV1-%Pred-Pre: 75 %
FEV1-Post: 2.15 L
FEV1-Pre: 2.29 L
FEV1FVC-%Change-Post: 2 %
FEV1FVC-%Pred-Pre: 110 %
FEV6-%Change-Post: -9 %
FEV6-%Pred-Post: 62 %
FEV6-%Pred-Pre: 68 %
FEV6-Post: 2.3 L
FEV6-Pre: 2.54 L
FEV6FVC-%Pred-Post: 102 %
FEV6FVC-%Pred-Pre: 102 %
FVC-%Change-Post: -8 %
FVC-%Pred-Post: 61 %
FVC-%Pred-Pre: 67 %
FVC-Post: 2.32 L
FVC-Pre: 2.54 L
Post FEV1/FVC ratio: 93 %
Post FEV6/FVC ratio: 100 %
Pre FEV1/FVC ratio: 90 %
Pre FEV6/FVC Ratio: 100 %
RV % pred: 73 %
RV: 1.25 L
TLC % pred: 71 %
TLC: 3.72 L

## 2023-07-23 MED ORDER — ALBUTEROL SULFATE (2.5 MG/3ML) 0.083% IN NEBU
2.5000 mg | INHALATION_SOLUTION | Freq: Once | RESPIRATORY_TRACT | Status: AC
  Administered 2023-07-23: 2.5 mg via RESPIRATORY_TRACT

## 2023-07-24 LAB — SEDIMENTATION RATE: Sed Rate: 62 mm/h — ABNORMAL HIGH (ref 0–32)

## 2023-07-25 NOTE — Patient Instructions (Addendum)
 It was great to see you! Thank you for allowing me to participate in your care!  I recommend that you always bring your medications to each appointment as this makes it easy to ensure we are on the correct medications and helps us  not miss when refills are needed.  Our plans for today:  - ED Follow up  Heart Failure / High Blood Pressure - Will have you follow up with Cardiology Sleep Study - I placed a referral, if you haven't heard anything in 3 weeks, call the clinic back and ask about the referral  Pulmonary function test - Completed  - Ear sensation  I believe this sensation is coming from congestion/swelling in your throat area associated with your ear. We are going to try some allergy meds to see if this improves.   -Zyrtec  10 mg daily  -Flonase  2 sprays each nostril, daily -If continues to bother you, or you develop ear pain, fevers, feeling unwell, make follow up appointment to be seen.     Take care and seek immediate care sooner if you develop any concerns.   Dr. Wilhemena Harbour, MD [facility]

## 2023-07-25 NOTE — Progress Notes (Signed)
  SUBJECTIVE:   CHIEF COMPLAINT / HPI:   ED f/u Admitted to hospital 5/2-5/4 for htn emergency, found to have HFrEF 30%, w/ G2DD and wall hypokinesis. Seen by cardiology 5/9 and started on Entresto  49-51 BID and Spironactone 12.5 mg. Pt to f/u w/ cardiology for Coronary CT, Rpt Echo. Pt to see Pulm for PFT and obtain Sleep study  Today:  Needing sleep study per her cardiologist and history of sleep breathing pauses. Has been told about apneic pauses, from friends and community members. Also notes daytime fatigue.   Right Ear Issues Noting weird sensation in her ears, that feels like fluid in ear. Has been going on for a couple of weeks. Has a hx of allergies but not on medication regularly.    PERTINENT  PMH / PSH:   OBJECTIVE:  BP (!) 152/90   Pulse 81   Ht 5\' 5"  (1.651 m)   Wt (!) 323 lb 9.6 oz (146.8 kg)   LMP 06/16/2023   SpO2 96%   BMI 53.85 kg/m  Physical Exam Constitutional:      General: She is not in acute distress.    Appearance: Normal appearance. She is not ill-appearing.  HENT:     Right Ear: Tympanic membrane and external ear normal.     Left Ear: Tympanic membrane and external ear normal.     Mouth/Throat:     Mouth: Mucous membranes are moist.     Pharynx: Oropharynx is clear.     Comments: Mallampati Class III-IV Cardiovascular:     Rate and Rhythm: Normal rate and regular rhythm.     Pulses: Normal pulses.     Heart sounds: Normal heart sounds. No murmur heard.    No friction rub. No gallop.  Pulmonary:     Effort: Pulmonary effort is normal. No respiratory distress.     Breath sounds: Normal breath sounds. No stridor. No wheezing, rhonchi or rales.  Neurological:     Mental Status: She is alert.      ASSESSMENT/PLAN:   Assessment & Plan Primary hypertension Patient with history of hypertensive emergency recently, found to have heart failure.  Blood pressure remains elevated today, patient on medication.  Cardiology managing, will continue to  monitor.  Patient has follow-up with cardiology this month.  No chest pain or shortness of breath. - Cardiology managing - Continue to monitor Obstructive sleep apnea Patient comes in with concern for OSA.  Patient reports history of apneic pauses from friends and community members.  Patient also appreciates feeling tired, waking up choking, and loud snoring.  Patient with BMI of 53.8, and Mallampati score of 3-4.  Patient's cardiologist wants patient to receive sleep study, will put in for sleep study. - Sleep study Irritation of ear, right Patient appreciates irritation/weird sensation in ear, for last 3 weeks.  Patient appreciates feels like fluid is there/moving around.  Patient is ear exam normal, normal TM.  Patient has history of allergies, does not take allergy medicine regularly, appreciate nasal congestion on evaluation.  Suspect patient may be having irritation in her eustachian tubes related to allergies.  Will trial allergy medicine, have patient follow-up if things not improved. - Zyrtec  10 mg daily - Flonase  2 sprays each nostril daily - Follow-up if symptoms persist, fevers develop, ear pain develops, or start feeling unwell No follow-ups on file. Wilhemena Harbour, MD 07/26/2023, 2:12 PM PGY-3, [facility]

## 2023-07-26 ENCOUNTER — Ambulatory Visit: Admitting: Student

## 2023-07-26 ENCOUNTER — Encounter: Payer: Self-pay | Admitting: Student

## 2023-07-26 VITALS — BP 156/98 | HR 81 | Ht 65.0 in | Wt 323.6 lb

## 2023-07-26 DIAGNOSIS — G4733 Obstructive sleep apnea (adult) (pediatric): Secondary | ICD-10-CM

## 2023-07-26 DIAGNOSIS — I10 Essential (primary) hypertension: Secondary | ICD-10-CM | POA: Diagnosis present

## 2023-07-26 DIAGNOSIS — H938X1 Other specified disorders of right ear: Secondary | ICD-10-CM | POA: Diagnosis not present

## 2023-07-26 MED ORDER — CETIRIZINE HCL 10 MG PO TABS: 10.0000 mg | ORAL_TABLET | Freq: Every day | ORAL | 11 refills | Status: AC | PRN

## 2023-07-26 MED ORDER — FLUTICASONE PROPIONATE 50 MCG/ACT NA SUSP: 2.0000 | Freq: Every day | NASAL | 6 refills | Status: AC

## 2023-07-26 NOTE — Assessment & Plan Note (Addendum)
 Patient with history of hypertensive emergency recently, found to have heart failure.  Blood pressure remains elevated today, patient on medication.  Cardiology managing, will continue to monitor.  Patient has follow-up with cardiology this month.  No chest pain or shortness of breath. - Cardiology managing - Continue to monitor

## 2023-07-26 NOTE — Addendum Note (Signed)
 Addended by: Cherylyn Cos on: 07/26/2023 05:21 PM   Modules accepted: Orders

## 2023-07-26 NOTE — Telephone Encounter (Signed)
 Call to patient to advise that sed rate is elevated on labs and Interstitial lung disease noted on PFTs with moderate restriction. Dr. Micael Adas would like patient to get in with Dr. Bertrum Brodie with pulmonary and  Dr. Marylin So with rheumatology given elevated sed rate. Patient verbalizes understanding and agrees to plan.

## 2023-07-26 NOTE — Addendum Note (Signed)
 Addended by: Cherylyn Cos on: 07/26/2023 05:02 PM   Modules accepted: Orders

## 2023-07-26 NOTE — Telephone Encounter (Signed)
-----   Message from Gaylyn Keas sent at 07/26/2023  3:00 PM EDT ----- Interstitial lung disease noted on PFTs with moderate restriction.  Patient needs to get in with Dr. Bertrum Brodie with pulmonary and I would also like her seen by Dr. Marylin So with rheumatology given elevated sed rate.

## 2023-07-26 NOTE — Assessment & Plan Note (Addendum)
 Patient comes in with concern for OSA.  Patient reports history of apneic pauses from friends and community members.  Patient also appreciates feeling tired, waking up choking, and loud snoring.  Patient with BMI of 53.8, and Mallampati score of 3-4.  Patient's cardiologist wants patient to receive sleep study, will put in for sleep study. - Sleep study

## 2023-07-26 NOTE — Assessment & Plan Note (Addendum)
 Patient appreciates irritation/weird sensation in ear, for last 3 weeks.  Patient appreciates feels like fluid is there/moving around.  Patient is ear exam normal, normal TM.  Patient has history of allergies, does not take allergy medicine regularly, appreciate nasal congestion on evaluation.  Suspect patient may be having irritation in her eustachian tubes related to allergies.  Will trial allergy medicine, have patient follow-up if things not improved. - Zyrtec  10 mg daily - Flonase  2 sprays each nostril daily - Follow-up if symptoms persist, fevers develop, ear pain develops, or start feeling unwell

## 2023-07-30 ENCOUNTER — Other Ambulatory Visit: Payer: Self-pay | Admitting: Cardiology

## 2023-07-30 DIAGNOSIS — R0609 Other forms of dyspnea: Secondary | ICD-10-CM

## 2023-07-31 ENCOUNTER — Encounter (HOSPITAL_COMMUNITY): Payer: Self-pay

## 2023-08-04 ENCOUNTER — Ambulatory Visit (HOSPITAL_COMMUNITY)
Admission: RE | Admit: 2023-08-04 | Discharge: 2023-08-04 | Disposition: A | Discharge status: Home / Self Care | Source: Ambulatory Visit | Attending: Cardiology | Admitting: Cardiology

## 2023-08-04 ENCOUNTER — Encounter (HOSPITAL_COMMUNITY)
Admission: RE | Admit: 2023-08-04 | Discharge: 2023-08-04 | Disposition: A | Discharge status: Home / Self Care | Source: Ambulatory Visit | Attending: Cardiology | Admitting: Cardiology

## 2023-08-04 DIAGNOSIS — I420 Dilated cardiomyopathy: Secondary | ICD-10-CM | POA: Diagnosis present

## 2023-08-04 DIAGNOSIS — I5043 Acute on chronic combined systolic (congestive) and diastolic (congestive) heart failure: Secondary | ICD-10-CM | POA: Diagnosis present

## 2023-08-04 DIAGNOSIS — I272 Pulmonary hypertension, unspecified: Secondary | ICD-10-CM | POA: Insufficient documentation

## 2023-08-04 DIAGNOSIS — R0609 Other forms of dyspnea: Secondary | ICD-10-CM | POA: Diagnosis present

## 2023-08-04 MED ORDER — IOHEXOL 350 MG/ML SOLN
100.0000 mL | Freq: Once | INTRAVENOUS | Status: AC | PRN
Start: 2023-08-04 — End: 2023-08-04
  Administered 2023-08-04: 100 mL via INTRAVENOUS

## 2023-08-04 MED ORDER — TECHNETIUM TO 99M ALBUMIN AGGREGATED
4.1000 | Freq: Once | INTRAVENOUS | Status: AC | PRN
  Administered 2023-08-04: 4.1 via INTRAVENOUS

## 2023-08-04 MED ORDER — NITROGLYCERIN 0.4 MG SL SUBL
SUBLINGUAL_TABLET | SUBLINGUAL | Status: AC
  Filled 2023-08-04: qty 2

## 2023-08-06 ENCOUNTER — Encounter: Payer: Self-pay | Admitting: Cardiology

## 2023-08-06 ENCOUNTER — Ambulatory Visit: Attending: Cardiology | Admitting: Cardiology

## 2023-08-06 VITALS — BP 136/82 | HR 81 | Ht 65.0 in | Wt 326.5 lb

## 2023-08-06 DIAGNOSIS — I5040 Unspecified combined systolic (congestive) and diastolic (congestive) heart failure: Secondary | ICD-10-CM | POA: Insufficient documentation

## 2023-08-06 DIAGNOSIS — I5042 Chronic combined systolic (congestive) and diastolic (congestive) heart failure: Secondary | ICD-10-CM

## 2023-08-06 DIAGNOSIS — Z79899 Other long term (current) drug therapy: Secondary | ICD-10-CM

## 2023-08-06 DIAGNOSIS — I050 Rheumatic mitral stenosis: Secondary | ICD-10-CM | POA: Diagnosis present

## 2023-08-06 DIAGNOSIS — I119 Hypertensive heart disease without heart failure: Secondary | ICD-10-CM | POA: Insufficient documentation

## 2023-08-06 DIAGNOSIS — I272 Pulmonary hypertension, unspecified: Secondary | ICD-10-CM | POA: Diagnosis present

## 2023-08-06 DIAGNOSIS — R0609 Other forms of dyspnea: Secondary | ICD-10-CM | POA: Diagnosis present

## 2023-08-06 DIAGNOSIS — I160 Hypertensive urgency: Secondary | ICD-10-CM | POA: Diagnosis present

## 2023-08-06 DIAGNOSIS — I110 Hypertensive heart disease with heart failure: Secondary | ICD-10-CM | POA: Diagnosis present

## 2023-08-06 DIAGNOSIS — I420 Dilated cardiomyopathy: Secondary | ICD-10-CM | POA: Diagnosis present

## 2023-08-06 MED ORDER — ENTRESTO 97-103 MG PO TABS: 1.0000 | ORAL_TABLET | Freq: Two times a day (BID) | ORAL | 3 refills | Status: DC

## 2023-08-06 NOTE — Patient Instructions (Signed)
 Medication Instructions:  Please INCREASE your dose of Entresto  to 97-103 mg twice a day.   *If you need a refill on your cardiac medications before your next appointment, please call your pharmacy*  Lab Work: Please complete  BMET at any [facility] in one week. You do not need to be fasting.  If you have labs (blood work) drawn today and your tests are completely normal, you will receive your results only by: MyChart Message (if you have MyChart) OR A paper copy in the mail If you have any lab test that is abnormal or we need to change your treatment, we will call you to review the results.  Testing/Procedures: Please keep your appointment for your echocardiogram on 09/16/23.   Follow-Up: At [facility], you and your health needs are our priority.  As part of our continuing mission to provide you with exceptional heart care, our providers are all part of one team.  This team includes your primary Cardiologist (physician) and Advanced Practice Providers or APPs (Physician Assistants and Nurse Practitioners) who all work together to provide you with the care you need, when you need it.  Your next appointment:   3 month(s)  Provider:   Dr. Gaylyn Keas, MD   We recommend signing up for the patient portal called "MyChart".  Sign up information is provided on this After Visit Summary.  MyChart is used to connect with patients for Virtual Visits (Telemedicine).  Patients are able to view lab/test results, encounter notes, upcoming appointments, etc.  Non-urgent messages can be sent to your provider as well.   To learn more about what you can do with MyChart, go to [email or web address].

## 2023-08-06 NOTE — Addendum Note (Signed)
 Addended by: Cherylyn Cos on: 08/06/2023 11:51 AM   Modules accepted: Orders

## 2023-08-06 NOTE — Progress Notes (Signed)
 Cardiology  Note    Date:  08/06/2023   ID:  SPIRIT WERNLI, DOB 1980/07/14, MRN [id number]  PCP:  Wilhemena Harbour, MD  Cardiologist:  Gaylyn Keas, MD   Chief Complaint  Patient presents with   Cardiomyopathy   Congestive Heart Failure   Hypertension    History of Present Illness:  Teresa Crane is a 43 y.o. female with a history of asthma, obesity, hypertension who  was hospitalized 07/09/2023 with hypertensive urgency with systolic blood pressure in the 180s to 190s when she presented with severe shortness of breath.  She has a history of morbid obesity and hypertension had been off blood pressure medicines for the past year.  She was given IV labetalol  10 mg x 1 and started on amlodipine  10 mg in the ER as well as carvedilol  12.5 mg twice daily.  BNP was elevated to 71 and chest CT showed mild cardiomegaly but no PE..  2D echo 07/10/2023 showed EF 30 to 35% with global HK and grade 2 diastolic dysfunction, moderate pulmonary hypertension, mild MR and mild mitral stenosis, moderate TR.  She was discharged home from the ER and set up for follow-up for cardiac consultation in our office today.  She she has had HTN for several years and was seen by multiple MDs and started on BP meds that she had a lot of side effects from and stopped taking meds. She has been told that snores badly in her sleep and has been told that she stops breathing in her sleep.    It was felt that her cardiomyopathy was hypertensive in etiology.  During her last office visit we did order test due for evaluate pulmonary hypertension including PFTs with DLCO (that showed possible interstitial lung disease with moderate restriction), sleep study, and VQ scan (no PE), CRP (mildly elevated at 12).  Sed rate to be done but was not.  Rheumatoid factor was normal.  She was referred to pulmonary for elevated CRP as well as PFTs that were concerning for restrictive lung process.  Home sleep study has not been performed  yet.  Coronary CTA was done which showed a coronary calcium score of 0 and normal coronary arteries with no evidence of CAD.  She was started on Entresto  24-26 mg twice daily as well as carvedilol  12.5 mg twice daily.  At last office visit her Entresto  was increased to 49-51 mg twice daily and she was started on spironolactone  12.5 mg daily.  Given her morbid obesity SGLT2i was held off on initiating.  She is here today for followup and is doing well.  She still has some SOB but improved.  Still notices it some with exertion. She denies any chest pain or pressure,  PND, orthopnea, LE edema (unless she stands on her feet all day), dizziness, palpitations or syncope. She is compliant with her meds and is tolerating meds with no SE.    Past Medical History:  Diagnosis Date   Asthma    HTN (hypertension), benign    Hypertensive cardiopathy    EF 30-35% by echo 07/2023   Obesity    Pulmonary HTN (HCC)     Past Surgical History:  Procedure Laterality Date   CYST EXCISION     KNEE SURGERY Left    ORIF ANKLE FRACTURE Right 04/28/2018   Procedure: OPEN REDUCTION INTERNAL FIXATION (ORIF) RIGHT ANKLE FRACTURE;  Surgeon: Osa Blase, MD;  Location: [facility] OR;  Service: Orthopedics;  Laterality: Right;    Current  Medications: Current Meds  Medication Sig   albuterol  (PROAIR  HFA) 108 (90 Base) MCG/ACT inhaler Inhale 2 puffs into the lungs every 6 (six) hours as needed for wheezing or shortness of breath.   albuterol  (PROVENTIL  HFA;VENTOLIN  HFA) 108 (90 Base) MCG/ACT inhaler Inhale 1-2 puffs into the lungs every 6 (six) hours as needed for wheezing or shortness of breath.   Blood Pressure Monitoring (ADULT BLOOD PRESSURE CUFF LG) KIT 1 Units by Does not apply route daily.   carvedilol  (COREG ) 12.5 MG tablet Take 1 tablet (12.5 mg total) by mouth 2 (two) times daily with a meal.   cetirizine  (ZYRTEC ) 10 MG tablet Take 1 tablet (10 mg total) by mouth daily as needed for allergies.   ferrous sulfate  325  (65 FE) MG tablet Take 1 tablet (325 mg total) by mouth daily before breakfast.   fluticasone  (FLONASE ) 50 MCG/ACT nasal spray Place 2 sprays into both nostrils daily.   sacubitril -valsartan  (ENTRESTO ) 49-51 MG Take 1 tablet by mouth 2 (two) times daily.   spironolactone  (ALDACTONE ) 25 MG tablet Take 0.5 tablets (12.5 mg total) by mouth daily.    Allergies:   Penicillins   Social History   Socioeconomic History   Marital status: Single    Spouse name: Not on file   Number of children: Not on file   Years of education: Not on file   Highest education level: Some college, no degree  Occupational History   Not on file  Tobacco Use   Smoking status: Former    Current packs/day: 0.00    Average packs/day: 0.5 packs/day for 20.0 years (10.0 ttl pk-yrs)    Types: Cigarettes    Start date: 05/22/1998    Quit date: 05/22/2018    Years since quitting: 5.2   Smokeless tobacco: Never  Vaping Use   Vaping status: Not on file  Substance and Sexual Activity   Alcohol use: Yes    Comment: social   Drug use: No   Sexual activity: Not on file  Other Topics Concern   Not on file  Social History Narrative   Not on file   Social Drivers of Health   Financial Resource Strain: Medium Risk (07/19/2023)   Overall Financial Resource Strain (CARDIA)    Difficulty of Paying Living Expenses: Somewhat hard  Food Insecurity: Food Insecurity Present (07/19/2023)   Hunger Vital Sign    Worried About Running Out of Food in the Last Year: Sometimes true    Ran Out of Food in the Last Year: Sometimes true  Transportation Needs: No Transportation Needs (07/19/2023)   PRAPARE - Administrator, Civil Service (Medical): No    Lack of Transportation (Non-Medical): No  Physical Activity: Insufficiently Active (07/19/2023)   Exercise Vital Sign    Days of Exercise per Week: 2 days    Minutes of Exercise per Session: 10 min  Stress: Stress Concern Present (07/19/2023)   [employer] of  Occupational Health - Occupational Stress Questionnaire    Feeling of Stress : To some extent  Social Connections: Moderately Integrated (07/19/2023)   Social Connection and Isolation Panel [NHANES]    Frequency of Communication with Friends and Family: More than three times a week    Frequency of Social Gatherings with Friends and Family: Once a week    Attends Religious Services: 1 to 4 times per year    Active Member of [employer] or Organizations: No    Attends Banker Meetings: Not on file  Marital Status: Living with partner     Family History:  The patient's family history is not on file.   ROS:   Please see the history of present illness.    ROS All other systems reviewed and are negative.     07/12/2023    2:37 PM  PAD Screen  Previous PAD dx? No  Previous surgical procedure? No  Pain with walking? Yes  Subsides with rest? Yes  Feet/toe relief with dangling? No  Painful, non-healing ulcers? No  Extremities discolored? No       PHYSICAL EXAM:   VS:  BP 136/82   Pulse 81   Ht 5\' 5"  (1.651 m)   Wt (!) 326 lb 8 oz (148.1 kg)   LMP 06/16/2023   SpO2 98%   BMI 54.33 kg/m    GEN: Well nourished, well developed in no acute distress HEENT: Normal NECK: No JVD; No carotid bruits LYMPHATICS: No lymphadenopathy CARDIAC:RRR, no murmurs, rubs, gallops RESPIRATORY:  Clear to auscultation without rales, wheezing or rhonchi  ABDOMEN: Soft, non-tender, non-distended MUSCULOSKELETAL:  No edema; No deformity  SKIN: Warm and dry NEUROLOGIC:  Alert and oriented x 3 PSYCHIATRIC:  Normal affect  Wt Readings from Last 3 Encounters:  08/06/23 (!) 326 lb 8 oz (148.1 kg)  07/26/23 (!) 323 lb 9.6 oz (146.8 kg)  07/16/23 (!) 317 lb 9.6 oz (144.1 kg)      Studies/Labs Reviewed:       Cardiac Studies & Procedures   ______________________________________________________________________________________________     ECHOCARDIOGRAM  ECHOCARDIOGRAM COMPLETE  07/10/2023  Narrative ECHOCARDIOGRAM REPORT    Patient Name:   [name] Date of Exam: 07/10/2023 Medical Rec #:  [id number]        Height:       65.0 in Accession #:    [id number]       Weight:       290.0 lb Date of Birth:  1980/10/07         BSA:          2.317 m Patient Age:    43 years         BP:           134/58 mmHg Patient Gender: F                HR:           80 bpm. Exam Location:  Inpatient  Procedure: 2D Echo, Cardiac Doppler, Color Doppler and Intracardiac Opacification Agent (Both Spectral and Color Flow Doppler were utilized during procedure).  Indications:    R06.02 SOB  History:        Patient has no prior history of Echocardiogram examinations. Signs/Symptoms:Shortness of Breath and Dyspnea; Risk Factors:Hypertension and Sleep Apnea.  Sonographer:    Raynelle Callow RDCS Referring Phys: [id number] Annette Killings [facility]   Sonographer Comments: Technically difficult study due to poor echo windows and patient is obese. Image acquisition challenging due to patient body habitus. IMPRESSIONS   1. Left ventricular ejection fraction, by estimation, is 30 to 35%. The left ventricle has moderately decreased function. The left ventricle demonstrates global hypokinesis. The left ventricular internal cavity size was severely dilated. Left ventricular diastolic parameters are consistent with Grade II diastolic dysfunction (pseudonormalization). Elevated left ventricular end-diastolic pressure. 2. Right ventricular systolic function is normal. The right ventricular size is normal. There is moderately elevated pulmonary artery systolic pressure. 3. Left atrial size was moderately dilated. 4. The mitral valve is degenerative. Mild mitral valve  regurgitation. Mild mitral stenosis. 5. Tricuspid valve regurgitation is moderate. 6. The aortic valve is tricuspid. There is mild calcification of the aortic valve. Aortic valve regurgitation is trivial. Aortic valve sclerosis is present, with  no evidence of aortic valve stenosis.  Comparison(s): No prior Echocardiogram.  FINDINGS Left Ventricle: Left ventricular ejection fraction, by estimation, is 30 to 35%. The left ventricle has moderately decreased function. The left ventricle demonstrates global hypokinesis. Definity  contrast agent was given IV to delineate the left ventricular endocardial borders. The left ventricular internal cavity size was severely dilated. There is no left ventricular hypertrophy. Left ventricular diastolic parameters are consistent with Grade II diastolic dysfunction (pseudonormalization). Elevated left ventricular end-diastolic pressure.  Right Ventricle: The right ventricular size is normal. No increase in right ventricular wall thickness. Right ventricular systolic function is normal. There is moderately elevated pulmonary artery systolic pressure. The tricuspid regurgitant velocity is 2.76 m/s, and with an assumed right atrial pressure of 15 mmHg, the estimated right ventricular systolic pressure is 45.5 mmHg.  Left Atrium: Left atrial size was moderately dilated.  Right Atrium: Right atrial size was normal in size.  Pericardium: There is no evidence of pericardial effusion.  Mitral Valve: The mitral valve is degenerative in appearance. Mild mitral valve regurgitation. Mild mitral valve stenosis. MV peak gradient, 13.0 mmHg. The mean mitral valve gradient is 5.0 mmHg.  Tricuspid Valve: The tricuspid valve is normal in structure. Tricuspid valve regurgitation is moderate . No evidence of tricuspid stenosis.  Aortic Valve: The aortic valve is tricuspid. There is mild calcification of the aortic valve. Aortic valve regurgitation is trivial. Aortic valve sclerosis is present, with no evidence of aortic valve stenosis.  Pulmonic Valve: The pulmonic valve was normal in structure. Pulmonic valve regurgitation is mild. No evidence of pulmonic stenosis.  Aorta: The aortic root and ascending aorta are  structurally normal, with no evidence of dilitation.  IAS/Shunts: No atrial level shunt detected by color flow Doppler.  Additional Comments: 3D was performed not requiring image post processing on an independent workstation and was indeterminate.   LEFT VENTRICLE PLAX 2D LVIDd:         6.10 cm      Diastology LVIDs:         5.10 cm      LV e' medial:    4.35 cm/s LV PW:         1.20 cm      LV E/e' medial:  34.6 LV IVS:        0.95 cm      LV e' lateral:   4.35 cm/s LVOT diam:     2.00 cm      LV E/e' lateral: 34.6 LV SV:         64 LV SV Index:   28 LVOT Area:     3.14 cm  LV Volumes (MOD) LV vol d, MOD A2C: 172.0 ml LV vol d, MOD A4C: 192.0 ml LV vol s, MOD A2C: 132.0 ml LV vol s, MOD A4C: 135.0 ml LV SV MOD A2C:     40.0 ml LV SV MOD A4C:     192.0 ml LV SV MOD BP:      50.1 ml  RIGHT VENTRICLE             IVC RV S prime:     11.70 cm/s  IVC diam: 2.50 cm TAPSE (M-mode): 2.2 cm  LEFT ATRIUM  Index        RIGHT ATRIUM           Index LA diam:        5.00 cm  2.16 cm/m   RA Area:     16.70 cm LA Vol (A2C):   103.0 ml 44.46 ml/m  RA Volume:   41.60 ml  17.96 ml/m LA Vol (A4C):   91.2 ml  39.37 ml/m LA Biplane Vol: 97.9 ml  42.26 ml/m AORTIC VALVE LVOT Vmax:   114.00 cm/s LVOT Vmean:  72.200 cm/s LVOT VTI:    0.204 m  AORTA Ao Root diam: 2.60 cm Ao Asc diam:  3.30 cm  MITRAL VALVE                TRICUSPID VALVE MV Area (PHT): 3.75 cm     TR Peak grad:   30.5 mmHg MV Area VTI:   1.68 cm     TR Vmax:        276.00 cm/s MV Peak grad:  13.0 mmHg MV Mean grad:  5.0 mmHg     SHUNTS MV Vmax:       1.80 m/s     Systemic VTI:  0.20 m MV Vmean:      108.0 cm/s   Systemic Diam: 2.00 cm MV Decel Time: 203 msec MV E velocity: 150.50 cm/s MV A velocity: 121.50 cm/s MV E/A ratio:  1.24  Janelle Mediate MD Electronically signed by Janelle Mediate MD Signature Date/Time: 07/10/2023/5:05:14 PM    Final      CT SCANS  CT CORONARY MORPH W/CTA COR W/SCORE  08/04/2023  Narrative CLINICAL DATA:  Chest pain  EXAM: Cardiac/Coronary CTA  TECHNIQUE: A non-contrast, gated CT scan was obtained with axial slices of 2.5 mm through the heart for calcium scoring. Calcium scoring was performed using the Agatston method. A 100 kV prospective, gated, contrast cardiac CT scan was obtained. Gantry rotation speed was 230 msec and collimation was 0.63 mm. Two sublingual nitroglycerin tablets (0.8 mg) were given. The 3D data set was reconstructed with motion correction for the best systolic or diastolic phase. Images were analyzed on a dedicated workstation using MPR, MIP, and VRT modes. The patient received 95 cc of contrast.  FINDINGS: Image quality: Excellent.  Noise artifact is: Limited.  Coronary Arteries:  Normal coronary origin.  Right dominance.  Left main: The left main is a large caliber vessel with a normal take off from the left coronary cusp that bifurcates to form a left anterior descending artery and a left circumflex artery. There is no plaque or stenosis.  Left anterior descending artery: The LAD is patent without evidence of plaque or stenosis. The LAD gives off 2 patent diagonal branches.  Left circumflex artery: The LCX is non-dominant and patent with no evidence of plaque or stenosis. The LCX gives off 2 patent obtuse marginal branches.  Right coronary artery: The RCA is dominant with normal take off from the right coronary cusp. There is no evidence of plaque or stenosis. The RCA terminates as a PDA and right posterolateral branch without evidence of plaque or stenosis.  Right Atrium: Right atrial size is within normal limits.  Right Ventricle: The right ventricular cavity is within normal limits.  Left Atrium: Left atrial size is normal in size with no left atrial appendage filling defect.  Left Ventricle: The ventricular cavity size is within normal limits.  Pulmonary arteries: Normal in size.  Pulmonary veins:  Normal pulmonary venous drainage.  Pericardium:  Normal thickness without significant effusion or calcium present.  Cardiac valves: The aortic valve is trileaflet without significant calcification. The mitral valve is normal without significant calcification.  Aorta: Normal caliber without significant disease.  Extra-cardiac findings: See attached radiology report for non-cardiac structures.  IMPRESSION: 1. Coronary calcium score of 0.  2. Normal coronary origin with right dominance.  3. Normal coronary arteries.  RECOMMENDATIONS: 1. CAD-RADS 0: No evidence of CAD (0%). Consider non-atherosclerotic causes of chest pain.  Jackquelyn Mass, MD   Electronically Signed By: Jackquelyn Mass M.D. On: 08/04/2023 21:14     ______________________________________________________________________________________________      Recent Labs: 07/09/2023: B Natriuretic Peptide 271.1 07/10/2023: TSH 2.276 07/11/2023: ALT 18; BUN 17; Creatinine, Ser 0.84; Hemoglobin 9.8; Magnesium 1.9; Platelets 250; Potassium 3.8; Sodium 137   Lipid Panel    Component Value Date/Time   CHOL 166 07/10/2023 0438   CHOL 184 08/20/2020 1738   TRIG 125 07/10/2023 0438   HDL 53 07/10/2023 0438   HDL 56 08/20/2020 1738   CHOLHDL 3.1 07/10/2023 0438   VLDL 25 07/10/2023 0438   LDLCALC 88 07/10/2023 0438   LDLCALC 112 (H) 08/20/2020 1738     Additional studies/ records that were reviewed today include:  Hospital records from 07/27/2023 including 2D echo and chest CT    ASSESSMENT:    1. Dyspnea on exertion   2. Hypertensive urgency   3. Dilated cardiomyopathy (HCC)   4. Chronic combined systolic and diastolic congestive heart failure (HCC)   5. Pulmonary hypertension, unspecified (HCC)   6. Mitral valve stenosis, unspecified etiology   7. Hypertensive heart disease with combined systolic and diastolic congestive heart failure, unspecified HF chronicity (HCC)   8. Pulmonary HTN (HCC)       PLAN:   In order of problems listed above:  #Dyspnea on exertion #Hypertensive urgency #Dilated cardiomyopathy #Acute combined systolic/diastolic CHF #Pulmonary hypertension #Probable obstructive sleep apnea #Mild mitral stenosis - Admitted with dyspnea on exertion in the setting of morbid obesity, hypertensive urgency and medical noncompliance with blood pressure medicines for the past year -TSH was normal - 2D echo personally reviewed showing EF 30 to 35% with global HK, G2 DD,moderate pulmonary hypertension, mild MR and mild mitral stenosis, moderate TR.  - Suspect that her new LV dysfunction is related to hypertensive cardiomyopathy - Pulmonary hypertension likely related to a combination of group 2 (pulmonary venous hypertension) and group 3 (untreated OSA) - Workup for secondary causes of pulmonary hypertension included: PFTs with DLCO>> showed restrictive lung process and now referred to pulmonary given her elevated CRP as well Split-night sleep study>> currently ending Rheumatoid factor normal VQ scan negative for PE  - Coronary CT showed coronary calcium score of 0 with no CAD - GDMT: Increase Entresto  to 97-103 mg twice daily Continue carvedilol  12.5 mg twice daily Increase spironolactone  to 25 mg daily Avoiding initiation of SGLT2i at this time given her morbid obesity and increased risk of UTI -Repeat 2D echo in 2 months  Time Spent: 20 minutes total time of encounter, including 15 minutes spent in face-to-face patient care on the date of this encounter. This time includes coordination of care and counseling regarding above mentioned problem list. Remainder of non-face-to-face time involved reviewing chart documents/testing relevant to the patient encounter and documentation in the medical record. I have independently reviewed documentation from referring provider  Followup: 2 weeks for nurse visit and 3 months with me  Medication Adjustments/Labs and Tests Ordered: Current  medicines are reviewed at  length with the patient today.  Concerns regarding medicines are outlined above.  Medication changes, Labs and Tests ordered today are listed in the Patient Instructions below.  There are no Patient Instructions on file for this visit.   Signed, Gaylyn Keas, MD  08/06/2023 11:37 AM    [facility] [address] [place], [place], [place]  [address] Phone: [phone]; Fax: [phone]

## 2023-08-09 ENCOUNTER — Telehealth: Payer: Self-pay

## 2023-08-09 ENCOUNTER — Ambulatory Visit: Payer: Self-pay | Admitting: Cardiology

## 2023-08-09 NOTE — Telephone Encounter (Signed)
 I started a split night sleep study PA through [facility] provider portal and it is currently pending their review. Case # [id number]

## 2023-08-10 ENCOUNTER — Ambulatory Visit (INDEPENDENT_AMBULATORY_CARE_PROVIDER_SITE_OTHER): Admitting: Internal Medicine

## 2023-08-10 ENCOUNTER — Encounter: Payer: Self-pay | Admitting: Internal Medicine

## 2023-08-10 VITALS — HR 76 | Ht 65.0 in | Wt 327.5 lb

## 2023-08-10 DIAGNOSIS — I5042 Chronic combined systolic (congestive) and diastolic (congestive) heart failure: Secondary | ICD-10-CM

## 2023-08-10 DIAGNOSIS — R053 Chronic cough: Secondary | ICD-10-CM | POA: Diagnosis not present

## 2023-08-10 DIAGNOSIS — D721 Eosinophilia, unspecified: Secondary | ICD-10-CM

## 2023-08-10 DIAGNOSIS — R0609 Other forms of dyspnea: Secondary | ICD-10-CM | POA: Diagnosis not present

## 2023-08-10 DIAGNOSIS — Z87898 Personal history of other specified conditions: Secondary | ICD-10-CM

## 2023-08-10 DIAGNOSIS — Z8709 Personal history of other diseases of the respiratory system: Secondary | ICD-10-CM

## 2023-08-10 DIAGNOSIS — I272 Pulmonary hypertension, unspecified: Secondary | ICD-10-CM | POA: Diagnosis not present

## 2023-08-10 DIAGNOSIS — Z889 Allergy status to unspecified drugs, medicaments and biological substances status: Secondary | ICD-10-CM

## 2023-08-10 NOTE — Patient Instructions (Addendum)
 ICD-10-CM   1. DOE (dyspnea on exertion)  R06.09     2. Chronic cough  R05.3     3. Chronic combined systolic and diastolic congestive heart failure (HCC)  I50.42     4. Pulmonary hypertension (HCC)  I27.20     5. History of asthma  Z87.09     6. History of seasonal allergies  Z88.9     7. Eosinophilia, unspecified type  D72.10      #Shortness of breath and cough with associated history of asthma and cardiac failure  - Much of his shortness of breath is coming from weight and cardiac issues [systolic heart failure, diastolic heart failure and pulmonary hypertension] but need to rule out asthma -Cough could be related to Entresto  but could also need to rule out asthma -No evidence of pulmonary fibrosis on CT scan and oxygen stress test is normal - Exhaled nitric oxide test today is normal  Plan - Empiric Trelegy 100 dose at 1 puff daily  =-Continue albuterol  as needed - Do RAST allergy panel with IgE   #History of snoring  Plan  - Sleep study per Dr. Micael Adas  # Pulmonary hypertension reported on echo May 2025  Can also be contributing to shortness of breath    Plan  =-Check autoimmune profile   #Follow-up - 4-8 weeks with nurse practitioner to discuss results and response to Trelegy

## 2023-08-10 NOTE — Progress Notes (Signed)
 OV 08/10/2023  Subjective:  Patient ID: Teresa Crane, female , DOB: 12-24-1980 , age 43 y.o. , MRN: [id number] , ADDRESS: [address] [place] [birth year] PCP Wilhemena Harbour, MD Patient Care Team: Wilhemena Harbour, MD as PCP - General (Family Medicine)  This Provider for this visit: Treatment Team:  Attending Provider: Jacqueline Matsu, MD    08/10/2023 -   Chief Complaint  Patient presents with   Shortness of Breath    Ambulation    Consult     HPI Teresa Crane 43 y.o. -new consultation on the request of Dr. Starr Eddy.  She is here for shortness of breath and cough.  She tells me that she has had asthma history as a preteen and young adult.  She has had 3 admissions for asthma.  The last one was 8 years ago.  She believes her asthma is under remission and she just uses albuterol  as needed.  She used to smoke marijuana but quit in November 2024.  She does not smoke tobacco.  She also history of seasonal allergies that is ongoing during the spring and the fall.  Blood review shows baseline eosinophils to be 300 cells per cubic millimeter.  No COPD history.  She does have obesity.  Review of the records indicate in 2022 they tried to refer her to pulmonary because of abnormal lung sounds and not otherwise specified.  However most recently Jul 09, 2023 she got admitted for hypertensive emergency and echocardiogram showed systolic and diastolic heart failure associated with hypertension.  There is a history of snoring and Dr. Micael Adas is trying to get her sleep studies.  Despite the admission and discharge she feels she is having progressive shortness of breath that is insidious onset getting worse.  Going on for few months.  There is also a dry cough that does wake her up at night.  At the hospital she was started on Entresto  which can increase cough but she says the cough is still the same and predates Entresto .  Current symptom scores are listed below.     SYMPTOM SCALE -  ILD 08/10/2023  Current weight   O2 use ra  Shortness of Breath 0 -> 5 scale with 5 being worst (score 6 If unable to do)  At rest 0  Simple tasks - showers, clothes change, eating, shaving 4  Household (dishes, doing bed, laundry) 3.5  Shopping 3.5  Walking level at own pace 3.5  Walking up Stairs 4.5  Total (30-36) Dyspnea Score 19  How bad is your cough? 3.5  How bad is your fatigue 4.5  How bad is nausea 1  How bad is vomiting?  0  How bad is diarrhea? 0  How bad is anxiety? 4.5  How bad is depression 3.5  Any chronic pain - if so where and how bad 0        SIT STAND TEST - goal 15 times   08/10/2023    O2 used ra   PRobe - finter or forehead finger   Number sit and stand completed - goal 15 15   Time taken to complete 2 min   Resting Pulse Ox/HR/Dyspnea  98% and 74/min and dyspnea of 0/10    Peak measures 98 % and 104/min and dyspnea of 7/10   Final Pulse Ox/HR 97% and 91/min and dyspnea of 3.5/10   Desaturated </= 88% no   Desaturated <= 3% points no   Got Tachycardic >/=  90/min yes   Miscellaneous comments Very dyspenic. DIDD NOT DESAT       Also lab review shows normal rheumatoid factor from early May 2025 but elevated ESR.  She is concerned about autoimmune disease. Exhaled nitric oxide today at the office is 8  HIV May 2025 is nonreactive.   IMPRESSION: No evidence of pulmonary embolus.   Mild cardiomegaly.   No acute cardiopulmonary disease.     Electronically Signed   By: Janeece Mechanic M.D.   On: 07/09/2023 20:18  PFT     Latest Ref Rng & Units 07/23/2023   10:55 AM  PFT Results  FVC-Pre L 2.54   FVC-Predicted Pre % 67   FVC-Post L 2.32   FVC-Predicted Post % 61   Pre FEV1/FVC % % 90   Post FEV1/FCV % % 93   FEV1-Pre L 2.29   FEV1-Predicted Pre % 75   FEV1-Post L 2.15   DLCO uncorrected ml/min/mmHg 14.24   DLCO UNC% % 63   DLCO corrected ml/min/mmHg 16.39   DLCO COR %Predicted % 73   DLVA Predicted % 112   TLC L 3.72   TLC %  Predicted % 71   RV % Predicted % 73        LAB RESULTS last 96 hours No results found.       has a past medical history of Asthma, HTN (hypertension), benign, Hypertensive cardiopathy, Obesity, and Pulmonary HTN (HCC).   reports that she quit smoking about 5 years ago. Her smoking use included cigarettes. She started smoking about 25 years ago. She has a 10 pack-year smoking history. She has never used smokeless tobacco.  Past Surgical History:  Procedure Laterality Date   CYST EXCISION     KNEE SURGERY Left    ORIF ANKLE FRACTURE Right 04/28/2018   Procedure: OPEN REDUCTION INTERNAL FIXATION (ORIF) RIGHT ANKLE FRACTURE;  Surgeon: Osa Blase, MD;  Location: [facility] OR;  Service: Orthopedics;  Laterality: Right;    Allergies  Allergen Reactions   Penicillins     Vaginal issue Has patient had a PCN reaction causing immediate rash, facial/tongue/throat swelling, SOB or lightheadedness with hypotension: No Has patient had a PCN reaction causing severe rash involving mucus membranes or skin necrosis: No Has patient had a PCN reaction that required hospitalization: No Has patient had a PCN reaction occurring within the last 10 years: No If all of the above answers are "NO", then may proceed with Cephalosporin use.      There is no immunization history on file for this patient.  History reviewed. No pertinent family history.   Current Outpatient Medications:    albuterol  (PROAIR  HFA) 108 (90 Base) MCG/ACT inhaler, Inhale 2 puffs into the lungs every 6 (six) hours as needed for wheezing or shortness of breath., Disp: 18 g, Rfl: 3   albuterol  (PROVENTIL  HFA;VENTOLIN  HFA) 108 (90 Base) MCG/ACT inhaler, Inhale 1-2 puffs into the lungs every 6 (six) hours as needed for wheezing or shortness of breath., Disp: 1 Inhaler, Rfl: 0   Blood Pressure Monitoring (ADULT BLOOD PRESSURE CUFF LG) KIT, 1 Units by Does not apply route daily., Disp: 1 kit, Rfl: 0   carvedilol  (COREG ) 12.5 MG  tablet, Take 1 tablet (12.5 mg total) by mouth 2 (two) times daily with a meal., Disp: 60 tablet, Rfl: 2   cetirizine  (ZYRTEC ) 10 MG tablet, Take 1 tablet (10 mg total) by mouth daily as needed for allergies., Disp: 30 tablet, Rfl: 11   ferrous sulfate  325 (  65 FE) MG tablet, Take 1 tablet (325 mg total) by mouth daily before breakfast., Disp: 30 tablet, Rfl: 2   fluticasone  (FLONASE ) 50 MCG/ACT nasal spray, Place 2 sprays into both nostrils daily., Disp: 16 g, Rfl: 6   sacubitril -valsartan  (ENTRESTO ) 97-103 MG, Take 1 tablet by mouth 2 (two) times daily., Disp: 180 tablet, Rfl: 3   spironolactone  (ALDACTONE ) 25 MG tablet, Take 0.5 tablets (12.5 mg total) by mouth daily., Disp: 90 tablet, Rfl: 3   metoprolol  tartrate (LOPRESSOR ) 100 MG tablet, Take 1 tablet (100 mg total) by mouth once for 1 dose. Take 90-120 minutes prior to scan. (Patient not taking: Reported on 08/06/2023), Disp: 1 tablet, Rfl: 0      Objective:   Vitals:   08/10/23 0823  Pulse: 76  SpO2: 99%  Weight: (!) 327 lb 8 oz (148.6 kg)  Height: 5\' 5"  (1.651 m)    Estimated body mass index is 54.5 kg/m as calculated from the following:   Height as of this encounter: 5\' 5"  (1.651 m).   Weight as of this encounter: 327 lb 8 oz (148.6 kg).  @WEIGHTCHANGE @  [employer]   08/10/23 0823  Weight: (!) 327 lb 8 oz (148.6 kg)     Physical Exam   General: No distress. obese O2 at rest: no Cane present: no Sitting in wheel chair: no Frail: no Obese: no Neuro: Alert and Oriented x 3. GCS 15. Speech normal Psych: Pleasant Resp:  Barrel Chest - no.  Wheeze - no, Crackles - no, No overt respiratory distress CVS: Normal heart sounds. Murmurs - no Ext: Stigmata of Connective Tissue Disease - mo HEENT: Normal upper airway. PEERL +. No post nasal drip        Assessment:       ICD-10-CM   1. DOE (dyspnea on exertion)  R06.09 Perennial allergen profile IgE    Antinuclear Antib (ANA)    Cyclic citrul peptide antibody, IgG     Anti-DNA antibody, double-stranded    Sjogrens syndrome-A extractable nuclear antibody    Sjogrens syndrome-B extractable nuclear antibody    Anti-scleroderma antibody    2. Chronic cough  R05.3 Perennial allergen profile IgE    Antinuclear Antib (ANA)    Cyclic citrul peptide antibody, IgG    Anti-DNA antibody, double-stranded    Sjogrens syndrome-A extractable nuclear antibody    Sjogrens syndrome-B extractable nuclear antibody    Anti-scleroderma antibody    3. Chronic combined systolic and diastolic congestive heart failure (HCC)  I50.42 Perennial allergen profile IgE    Antinuclear Antib (ANA)    Cyclic citrul peptide antibody, IgG    Anti-DNA antibody, double-stranded    Sjogrens syndrome-A extractable nuclear antibody    Sjogrens syndrome-B extractable nuclear antibody    Anti-scleroderma antibody    4. Pulmonary hypertension (HCC)  I27.20 Perennial allergen profile IgE    Antinuclear Antib (ANA)    Cyclic citrul peptide antibody, IgG    Anti-DNA antibody, double-stranded    Sjogrens syndrome-A extractable nuclear antibody    Sjogrens syndrome-B extractable nuclear antibody    Anti-scleroderma antibody    5. History of asthma  Z87.09 Perennial allergen profile IgE    Antinuclear Antib (ANA)    Cyclic citrul peptide antibody, IgG    Anti-DNA antibody, double-stranded    Sjogrens syndrome-A extractable nuclear antibody    Sjogrens syndrome-B extractable nuclear antibody    Anti-scleroderma antibody    6. History of seasonal allergies  Z88.9 Perennial allergen profile IgE    Antinuclear  Antib (ANA)    Cyclic citrul peptide antibody, IgG    Anti-DNA antibody, double-stranded    Sjogrens syndrome-A extractable nuclear antibody    Sjogrens syndrome-B extractable nuclear antibody    Anti-scleroderma antibody    7. Eosinophilia, unspecified type  D72.10 Perennial allergen profile IgE    Antinuclear Antib (ANA)    Cyclic citrul peptide antibody, IgG    Anti-DNA  antibody, double-stranded    Sjogrens syndrome-A extractable nuclear antibody    Sjogrens syndrome-B extractable nuclear antibody    Anti-scleroderma antibody    8. History of snoring  Z87.898 Perennial allergen profile IgE    Antinuclear Antib (ANA)    Cyclic citrul peptide antibody, IgG    Anti-DNA antibody, double-stranded    Sjogrens syndrome-A extractable nuclear antibody    Sjogrens syndrome-B extractable nuclear antibody    Anti-scleroderma antibody         Plan:     Patient Instructions     ICD-10-CM   1. DOE (dyspnea on exertion)  R06.09     2. Chronic cough  R05.3     3. Chronic combined systolic and diastolic congestive heart failure (HCC)  I50.42     4. Pulmonary hypertension (HCC)  I27.20     5. History of asthma  Z87.09     6. History of seasonal allergies  Z88.9     7. Eosinophilia, unspecified type  D72.10      #Shortness of breath and cough with associated history of asthma and cardiac failure  - Much of his shortness of breath is coming from weight and cardiac issues [systolic heart failure, diastolic heart failure and pulmonary hypertension] but need to rule out asthma -Cough could be related to Entresto  but could also need to rule out asthma -No evidence of pulmonary fibrosis on CT scan and oxygen stress test is normal - Exhaled nitric oxide test today is normal  Plan - Empiric Trelegy 100 dose at 1 puff daily  =-Continue albuterol  as needed - Do RAST allergy panel with IgE   #History of snoring  Plan  - Sleep study per Dr. Micael Adas  # Pulmonary hypertension reported on echo May 2025  Can also be contributing to shortness of breath    Plan  =-Check autoimmune profile   #Follow-up - 4-8 weeks with nurse practitioner to discuss results and response to Trelegy   FOLLOWUP Return in about 6 weeks (around 09/21/2023) for with any of the APPS, Face to Face Visit.    SIGNATURE    Dr. Maire Scot, M.D., F.C.C.P,  Pulmonary and  Critical Care Medicine Staff Physician, [facility] Director - Interstitial Lung Disease  Program  Pulmonary Fibrosis [facility] at [facility] [place], [place], [address]  Pager: [phone], If no answer or between  15:00h - 7:00h: call [phone] Telephone: [phone]  8:58 AM 08/10/2023

## 2023-08-11 NOTE — Telephone Encounter (Signed)
-----   Message from Teresa Crane sent at 08/09/2023  9:09 AM EDT ----- Cxray showed mildly enlarged LV but otherwise normal.  She has known DCM

## 2023-08-11 NOTE — Telephone Encounter (Signed)
 Call to patient to advise that her chest xray showed mildly enlarged LV but otherwise normal.  She has known DCM, patient verbalizes understanding.

## 2023-08-12 ENCOUNTER — Telehealth: Payer: Self-pay

## 2023-08-12 DIAGNOSIS — I272 Pulmonary hypertension, unspecified: Secondary | ICD-10-CM

## 2023-08-12 DIAGNOSIS — I5042 Chronic combined systolic (congestive) and diastolic (congestive) heart failure: Secondary | ICD-10-CM

## 2023-08-12 DIAGNOSIS — I119 Hypertensive heart disease without heart failure: Secondary | ICD-10-CM

## 2023-08-12 NOTE — Telephone Encounter (Signed)
 Referred patient to Dr. Stefan Edge for rheumatology on 07/29/23, received fax that Dr. Meredith Stalls does not take patient's insurance. Went on [facility] website, the only rheumatology provider within 100 miles is:   [facility] [address] [place], [place] [address] Phone: [phone] Fax [phone]  Referral placed to this practice, sent MyChart message to patient.

## 2023-08-13 ENCOUNTER — Ambulatory Visit: Attending: Student

## 2023-08-13 LAB — ALLERGEN PROFILE, PERENNIAL ALLERGEN IGE
Alternaria Alternata IgE: 18.9 kU/L — AB
Aspergillus Fumigatus IgE: 0.39 kU/L — AB
Aureobasidi Pullulans IgE: 0.76 kU/L — AB
Candida Albicans IgE: 5.56 kU/L — AB
Cat Dander IgE: 0.2 kU/L — AB
Chicken Feathers IgE: 0.26 kU/L — AB
Cladosporium Herbarum IgE: 0.21 kU/L — AB
Cow Dander IgE: <0.1 kU/L
D Farinae IgE: 1.09 kU/L — AB
D Pteronyssinus IgE: 1.07 kU/L — AB
Dog Dander IgE: 5.8 kU/L — AB
Duck Feathers IgE: <0.1 kU/L
Goose Feathers IgE: <0.1 kU/L
Mouse Urine IgE: <0.1 kU/L
Mucor Racemosus IgE: <0.1 kU/L
Penicillium Chrysogen IgE: 0.16 kU/L — AB
Phoma Betae IgE: 0.67 kU/L — AB
Setomelanomma Rostrat: 1.18 kU/L — AB
Stemphylium Herbarum IgE: 9.53 kU/L — AB

## 2023-08-13 LAB — CYCLIC CITRUL PEPTIDE ANTIBODY, IGG: Cyclic Citrullin Peptide Ab: <16 U

## 2023-08-13 LAB — SJOGRENS SYNDROME-B EXTRACTABLE NUCLEAR ANTIBODY: SSB (La) (ENA) Antibody, IgG: <1 AI

## 2023-08-13 LAB — ANTI-NUCLEAR AB-TITER (ANA TITER): ANA Titer 1: 1:40 {titer} — ABNORMAL HIGH

## 2023-08-13 LAB — ANTI-DNA ANTIBODY, DOUBLE-STRANDED: ds DNA Ab: 1 [IU]/mL

## 2023-08-13 LAB — SJOGRENS SYNDROME-A EXTRACTABLE NUCLEAR ANTIBODY: SSA (Ro) (ENA) Antibody, IgG: <1 AI

## 2023-08-13 LAB — ANA: Anti Nuclear Antibody (ANA): POSITIVE — AB

## 2023-08-13 LAB — ANTI-SCLERODERMA ANTIBODY: Scleroderma (Scl-70) (ENA) Antibody, IgG: <1 AI

## 2023-08-16 NOTE — Telephone Encounter (Signed)
**Note De-Identified Teresa Crane Obfuscation** Letter received from [facility] provider portal stating that they have approved coverage of the pts Split Night Sleep Study from 08/09/2023-11/14/2023. Authorization #: [id number]  I have transferred the order to the sleep lab so they can contact the pt to schedule the test.

## 2023-08-17 ENCOUNTER — Encounter: Payer: Self-pay | Admitting: *Deleted

## 2023-09-06 ENCOUNTER — Ambulatory Visit (HOSPITAL_BASED_OUTPATIENT_CLINIC_OR_DEPARTMENT_OTHER): Attending: Cardiology | Admitting: Cardiology

## 2023-09-06 DIAGNOSIS — G4736 Sleep related hypoventilation in conditions classified elsewhere: Secondary | ICD-10-CM | POA: Insufficient documentation

## 2023-09-06 DIAGNOSIS — G4733 Obstructive sleep apnea (adult) (pediatric): Secondary | ICD-10-CM | POA: Insufficient documentation

## 2023-09-12 NOTE — Procedures (Signed)
 [facility] [address] [place], [place] [address] Tel: [phone]   Fax: [phone]  Split Night Interpretation  Patient Name:  Teresa Crane, Teresa Crane Date:  09/06/2023 Referring Physician:  SHLOMO WILBERT SAUNDERS., MD  Indications for Polysomnography The patient is a 43 year old Female who is 5' 5 and weighs 315.0 lbs.  Her BMI equals 52.5.  A diagnostic polysomnogram was performed to evaluate for Obstructive Sleep Apnea.  After 124.5 minutes of sleep time the patient exhibited sufficient respiratory events qualifying her for a CPAP trial which was then initiated.    Medication  No Data.   Polysomnogram Data A full night polysomnogram was performed recording the standard physiologic parameters including EEG, EOG, EMG, EKG, nasal and oral airflow.  Respiratory parameters of chest and abdominal movements are recorded with Peizo-Crystal motion transducers.  Oxygen saturation was recorded by pulse oximetry.    Sleep Architecture The total recording time of the diagnostic portion of the study was 132.9 minutes.  The total sleep time was 124.5 minutes.  During the diagnostic portion of the study, the patient spent 2.4% of total sleep time in Stage N1, 97.6% in Stage N2, 0.0% in Stages N3, and 0.0% in REM.   Sleep latency was 4.4 minutes.  REM latency was 0 minutes.  Sleep Efficiency was 93.7%.  Wake after Sleep Onset time was 4.0 minutes.   At 11:52:51 PM the patient was placed on PAP treatment and was titrated at pressures ranging from 5 cm/H20 up to 19 cm/H20.The total recording time of the treatment portion of the study was 313.7 minutes.  The total sleep time was 276.5 minutes.  During the treatment portion of the study, the patient spent 2.0% of total sleep time in Stage N1, 54.1% in Stage N2, 13.9% in Stages N3, and 30.0% in REM.   Sleep latency was 5.5 minutes.  REM latency was 71.0 minutes.  Sleep Efficiency was 88.1%.  Wake after Sleep Onset time  was 32.0 minutes.  Respiratory Events During the diagnostic portion of the study, the polysomnogram revealed a presence of 101 obstructive, 0 central, and 0 mixed apneas resulting in an Apnea index of 48.7 events per hour.  There were 190 hypopneas (>=3% desaturation and/or arousal) resulting in an Apnea\Hypopnea Index (AHI >=3% desaturation and/or arousal) of 140.2 events per hour.  There were 179 hypopneas (>=4% desaturation) resulting in an Apnea\Hypopnea Index (AHI >=4% desaturation) of 134.9 events per hour.  There were 12 Respiratory Effort Related Arousals resulting in a RERA index of 5.8 events per hour. The Respiratory Disturbance Index is 146.0 events per hour.  The snore index was 0 events per hour.  Mean oxygen saturation was 89.8%.  The lowest oxygen saturation during sleep was 74.0%.  Time spent <=88% oxygen saturation was 45.2 minutes (34.0%).  During the treatment portion of the study, the polysomnogram revealed a presence of 28 obstructive, 13 central, and 0 mixed apneas resulting in an Apnea index of 8.9 events per hour.  There were 157 hypopneas (>=3% desaturation and/or arousal) resulting in an Apnea\Hypopnea Index (AHI >=3% desaturation and/or arousal) of 43.0 events per hour.  There were 104 hypopneas (>=4% desaturation) resulting in an Apnea\Hypopnea Index (AHI >=4% desaturation) of 31.5 events per hour.  There were 41 Respiratory Effort Related Arousals resulting in a RERA index of 8.9 events per hour. The Respiratory Disturbance Index is 51.9 events per hour.  The snore index was 0 events per hour.  Mean  oxygen saturation was 94.7%.  The lowest oxygen saturation during sleep was 75.0%.  Time spent <=88% oxygen saturation was 7.3 minutes (2.4%).  Limb Activity During the diagnostic portion of the study, there were 0  limb movements recorded.    During the treatment portion of the study, there were 0 limb movements recorded.   Cardiac Summary During the diagnostic portion of the  study, the average pulse rate was 67.7 bpm.  The minimum pulse rate was 59.0 bpm while the maximum pulse rate was 81.0 bpm.  During the treatment portion of the study, the average pulse rate was 71.6 bpm.  The minimum pulse rate was 47.0 bpm while the maximum pulse rate was 89.0 bpm.   Diagnosis:  Severe Obstructive Sleep Apnea Nocturnal Hypoxemia  Recommendations: 1. Recommend a trial of ResMed CPAP at 17cm H2O, EPR 3, heated humidity and medium Airfit P10 nasal pillow mask.  2. Healthy sleep recommendations include: adequate nightly sleep (normal 7-9 hrs/night), avoidance of caffeine after  noon and alcohol near bedtime, and maintaining a sleep environment that is cool, dark and quiet. 3. Weight loss for overweight patients is recommended.  4. Snoring recommendations include: weight loss where appropriate, side sleeping, and avoidance of alcohol before  bed. 5. Operation of motor vehicle or dangerous equipment must be avoided when feeling drowsy, excessively sleepy, or  mentally fatigued.  6. An ENT consultation which may be useful for specific causes of and possible treatment of bothersome snoring .  7. Weight loss may be of benefit in reducing the severity of snoring    This study was personally reviewed and electronically signed by: SHLOMO WILBERT SAUNDERS., MD Accredited Board Certified in Sleep Medicine Date/Time: 09/12/2023 5:42PM

## 2023-09-15 ENCOUNTER — Other Ambulatory Visit (HOSPITAL_COMMUNITY)

## 2023-09-16 ENCOUNTER — Ambulatory Visit: Admitting: Cardiology

## 2023-09-16 ENCOUNTER — Other Ambulatory Visit (HOSPITAL_COMMUNITY)

## 2023-09-16 ENCOUNTER — Ambulatory Visit (HOSPITAL_COMMUNITY)

## 2023-09-16 ENCOUNTER — Telehealth: Payer: Self-pay

## 2023-09-16 NOTE — Telephone Encounter (Signed)
-----   Message from Wilbert Bihari sent at 09/12/2023  5:44 PM EDT ----- Please let patient know that they have sleep apnea with successful PAP titration.  PAP ordered placed in Epic.  Followup in 6 weeks after starting PAP therapy

## 2023-09-16 NOTE — Telephone Encounter (Signed)
 Notified patient of successful Titration. Order for new device and supplies sent to [facility] today.

## 2023-09-20 ENCOUNTER — Telehealth: Payer: Self-pay

## 2023-09-20 ENCOUNTER — Other Ambulatory Visit: Payer: Self-pay

## 2023-09-20 DIAGNOSIS — I10 Essential (primary) hypertension: Secondary | ICD-10-CM

## 2023-09-20 DIAGNOSIS — Z79899 Other long term (current) drug therapy: Secondary | ICD-10-CM

## 2023-09-20 MED ORDER — SPIRONOLACTONE 25 MG PO TABS: 12.5000 mg | ORAL_TABLET | Freq: Every day | ORAL | 3 refills | Status: DC

## 2023-09-20 NOTE — Telephone Encounter (Signed)
 Spironolactone  25 mg reordered, BMET order placed. Asked patient to complete BMET in one week via [facility].

## 2023-09-21 NOTE — Telephone Encounter (Signed)
 Script sent

## 2023-09-27 ENCOUNTER — Encounter: Payer: Self-pay | Admitting: Adult Health

## 2023-09-27 ENCOUNTER — Ambulatory Visit (INDEPENDENT_AMBULATORY_CARE_PROVIDER_SITE_OTHER): Admitting: Adult Health

## 2023-09-27 VITALS — BP 140/88 | HR 86 | Temp 98.2°F | Ht 66.0 in | Wt 335.0 lb

## 2023-09-27 DIAGNOSIS — J4540 Moderate persistent asthma, uncomplicated: Secondary | ICD-10-CM | POA: Diagnosis not present

## 2023-09-27 DIAGNOSIS — I10 Essential (primary) hypertension: Secondary | ICD-10-CM

## 2023-09-27 DIAGNOSIS — Z77120 Contact with and (suspected) exposure to mold (toxic): Secondary | ICD-10-CM

## 2023-09-27 DIAGNOSIS — G4733 Obstructive sleep apnea (adult) (pediatric): Secondary | ICD-10-CM

## 2023-09-27 DIAGNOSIS — I509 Heart failure, unspecified: Secondary | ICD-10-CM

## 2023-09-27 DIAGNOSIS — J3089 Other allergic rhinitis: Secondary | ICD-10-CM

## 2023-09-27 DIAGNOSIS — I272 Pulmonary hypertension, unspecified: Secondary | ICD-10-CM

## 2023-09-27 MED ORDER — TRELEGY ELLIPTA 200-62.5-25 MCG/ACT IN AEPB
1.0000 | INHALATION_SPRAY | Freq: Every day | RESPIRATORY_TRACT | 5 refills | Status: DC
Start: 2023-09-27 — End: 2023-10-22

## 2023-09-27 MED ORDER — ALBUTEROL SULFATE (2.5 MG/3ML) 0.083% IN NEBU: 2.5000 mg | INHALATION_SOLUTION | Freq: Four times a day (QID) | RESPIRATORY_TRACT | 5 refills | Status: AC | PRN

## 2023-09-27 MED ORDER — MONTELUKAST SODIUM 10 MG PO TABS: 10.0000 mg | ORAL_TABLET | Freq: Every day | ORAL | 11 refills | Status: AC

## 2023-09-27 NOTE — Patient Instructions (Addendum)
 Continue on Trelegy 1 puff daily, rinse after use.  Albuterol  inhaler or neb As needed   Continue on Zyrtec  daily  Add Singulair  10mg  At bedtime .  Recommend moving out of current living environment due to mold exposure.  Activity as tolerated.  Work on healthy weight loss  Begin CPAP as recommended by Cardiology.  Follow up with Dr. Geronimo in 3-4 months and As needed

## 2023-09-27 NOTE — Progress Notes (Signed)
 @Patient  ID: Teresa Crane, female    DOB: 10/22/1980, 43 y.o.   MRN: [id number]  No chief complaint on file.   Referring provider: Jennelle Riis, MD  HPI: 43 yo female former smoker seen for pulmonary consult 08/09/23 for dyspnea and asthma  Medical history significant for combined systolic/diastolic CHF and OSA, HTN  TEST/EVENTS :  Exhaled nitric oxide 08/2023 - 8  HIV May 2025 is nonreactive. CXR 07/2023 nad  PFT Moderate Restriction DLCO 63% Echo 07/10/2023 EF 30-35%, Moderately elevated PAP Hospital Admission 07/2023-HTN urgency, Cadiomyopathy  Coronary CT -Calcium score 0, no evidence of CAD -lungs clear   09/27/2023 Follow up : Asthma, CHF, Pulmonary HTN, OSA Discussed the use of AI scribe software for clinical note transcription with the patient, who gave verbal consent to proceed.  History of Present Illness   Teresa Crane is a 43 year old female with congestive heart failure and pulmonary hypertension presents for a 6-week follow-up.  Patient was seen last visit for a pulmonary consult for asthma and shortness of breath.  Last visit patient was started on Trelegy inhaler for suspected asthma.  Says that this has helped her breathing.  Has some intermittent cough.   previous pulmonary function testing showed moderate restriction.  Labs last visit showed a minimally elevated ANA at 1: 40, cytoplasmic.  CCP negative.  Sjogren's, anti-DNA, antiscleroderma and rheumatoid factor negative.  Allergy panel was very elevated-positive triggers to dust, dog and cat dander, feathers, mold.   She continues to follow-up with cardiology after recent hospitalization for severe hypertension and cardiomyopathy.   She has leg swelling has been improved.  She is maintained on metoprolol  and Entresto , Aldactone .  Her home environment, which has significant mold, exacerbates her symptoms.  She has contacted the housing manager multiple times without success.  She does take Zyrtec  daily.  She  has recently been found to have severe obstructive sleep apnea.  Has been recommended to start on CPAP therapy.  She is awaiting set up through local homecare company.  Followed by cardiology        Allergies  Allergen Reactions   Penicillins     Vaginal issue Has patient had a PCN reaction causing immediate rash, facial/tongue/throat swelling, SOB or lightheadedness with hypotension: No Has patient had a PCN reaction causing severe rash involving mucus membranes or skin necrosis: No Has patient had a PCN reaction that required hospitalization: No Has patient had a PCN reaction occurring within the last 10 years: No If all of the above answers are NO, then may proceed with Cephalosporin use.      There is no immunization history on file for this patient.  Past Medical History:  Diagnosis Date   Asthma    HTN (hypertension), benign    Hypertensive cardiopathy    EF 30-35% by echo 07/2023   Obesity    Pulmonary HTN (HCC)     Tobacco History: Social History   Tobacco Use  Smoking Status Former   Current packs/day: 0.00   Average packs/day: 0.5 packs/day for 20.0 years (10.0 ttl pk-yrs)   Types: Cigarettes   Start date: 05/22/1998   Quit date: 05/22/2018   Years since quitting: 5.3  Smokeless Tobacco Never   Counseling given: Not Answered   Outpatient Medications Prior to Visit  Medication Sig Dispense Refill   albuterol  (PROAIR  HFA) 108 (90 Base) MCG/ACT inhaler Inhale 2 puffs into the lungs every 6 (six) hours as needed for wheezing or shortness of breath. 18  g 3   albuterol  (PROVENTIL  HFA;VENTOLIN  HFA) 108 (90 Base) MCG/ACT inhaler Inhale 1-2 puffs into the lungs every 6 (six) hours as needed for wheezing or shortness of breath. 1 Inhaler 0   Blood Pressure Monitoring (ADULT BLOOD PRESSURE CUFF LG) KIT 1 Units by Does not apply route daily. 1 kit 0   carvedilol  (COREG ) 12.5 MG tablet Take 1 tablet (12.5 mg total) by mouth 2 (two) times daily with a meal. 60 tablet 2    cetirizine  (ZYRTEC ) 10 MG tablet Take 1 tablet (10 mg total) by mouth daily as needed for allergies. 30 tablet 11   ferrous sulfate  325 (65 FE) MG tablet Take 1 tablet (325 mg total) by mouth daily before breakfast. 30 tablet 2   fluticasone  (FLONASE ) 50 MCG/ACT nasal spray Place 2 sprays into both nostrils daily. 16 g 6   metoprolol  tartrate (LOPRESSOR ) 100 MG tablet Take 1 tablet (100 mg total) by mouth once for 1 dose. Take 90-120 minutes prior to scan. 1 tablet 0   sacubitril -valsartan  (ENTRESTO ) 97-103 MG Take 1 tablet by mouth 2 (two) times daily. 180 tablet 3   spironolactone  (ALDACTONE ) 25 MG tablet Take 0.5 tablets (12.5 mg total) by mouth daily. 45 tablet 3   No facility-administered medications prior to visit.     Review of Systems:   Constitutional:   No  weight loss, night sweats,  Fevers, chills, +fatigue, or  lassitude.  HEENT:   No headaches,  Difficulty swallowing,  Tooth/dental problems, or  Sore throat,                No sneezing, itching, ear ache, nasal congestion, post nasal drip,   CV:  No chest pain,  Orthopnea, PND, +swelling in lower extremities, anasarca, dizziness, palpitations, syncope.   GI  No heartburn, indigestion, abdominal pain, nausea, vomiting, diarrhea, change in bowel habits, loss of appetite, bloody stools.   Resp:  No wheezing.  No chest wall deformity  Skin: no rash or lesions.  GU: no dysuria, change in color of urine, no urgency or frequency.  No flank pain, no hematuria   MS:  No joint pain or swelling.  No decreased range of motion.  No back pain.    Physical Exam  BP (!) 140/88 (BP Location: Right Arm, Patient Position: Sitting, Cuff Size: Large)   Pulse 86   Temp 98.2 F (36.8 C) (Oral)   Ht 5' 6 (1.676 m)   Wt (!) 335 lb (152 kg)   SpO2 97%   BMI 54.07 kg/m   GEN: A/Ox3; pleasant , NAD, well nourished    HEENT:  [place]/AT,  NOSE-clear, THROAT-clear, no lesions, no postnasal drip or exudate noted.   NECK:  Supple w/ fair  ROM; no JVD; normal carotid impulses w/o bruits; no thyromegaly or nodules palpated; no lymphadenopathy.    RESP  Clear  P & A; w/o, wheezes/ rales/ or rhonchi. no accessory muscle use, no dullness to percussion  CARD:  RRR, no m/r/g, tr peripheral edema, pulses intact, no cyanosis or clubbing.  GI:   Soft & nt; nml bowel sounds; no organomegaly or masses detected.   Musco: Warm bil, no deformities or joint swelling noted.   Neuro: alert, no focal deficits noted.    Skin: Warm, no lesions or rashes      BNP   ProBNP No results found for: PROBNP  Imaging: No results found.  Administration History     None  Latest Ref Rng & Units 07/23/2023   10:55 AM  PFT Results  FVC-Pre L 2.54   FVC-Predicted Pre % 67   FVC-Post L 2.32   FVC-Predicted Post % 61   Pre FEV1/FVC % % 90   Post FEV1/FCV % % 93   FEV1-Pre L 2.29   FEV1-Predicted Pre % 75   FEV1-Post L 2.15   DLCO uncorrected ml/min/mmHg 14.24   DLCO UNC% % 63   DLCO corrected ml/min/mmHg 16.39   DLCO COR %Predicted % 73   DLVA Predicted % 112   TLC L 3.72   TLC % Predicted % 71   RV % Predicted % 73     No results found for: NITRICOXIDE      Assessment & Plan:   No problem-specific Assessment & Plan notes found for this encounter.   Assessment and Plan    Asthma with allergic phenotype. Allergy panel with high reaction to mold allergens.  Symptoms have improved with Trelegy. Mold in her living environment likely contributes to exacerbations.  Continue on Trelegy daily.. Continue albuterol  inhaler as needed. Add Singulair  once daily at bedtime. Recommend moving out of the mold-exposed environment if housing manager is unable to fixed mold issues.  Use a mask when using strong cleaners at work.  Mold Allergy  -positive trigger on panel .  Mold exposure in her current living environment likely exacerbates asthma and allergic rhinitis symptoms.Highly recommend need alternative living  environment to prevent mold exposure. . Provide a letter documenting the medical recommendation to move due to mold exposure.  Allergic Rhinitis   Allergic rhinitis presents with watery eyes, itchy nose, and runny nose. Symptoms are likely exacerbated by mold and other allergens. Continue Zyrtec  daily. Add Singulair  once daily at bedtime.  Sleep Apnea  -recently diagnosed.  Followed by cardiology.  To begin CPAP in the near future   Congestive Heart Failure  -continue follow-up with cardiology. Recent hospitalization for hypertensive emergency n Continue management with cardiology for blood pressure control. Use the CPAP machine for sleep apnea to reduce stress on the heart.  Hypertension continue follow-up with cardiology Continue antihypertensive medications as prescribed by cardiology.        Madelin Stank, NP 09/27/2023

## 2023-09-30 ENCOUNTER — Ambulatory Visit

## 2023-10-15 ENCOUNTER — Ambulatory Visit: Admitting: Primary Care

## 2023-10-19 ENCOUNTER — Other Ambulatory Visit (HOSPITAL_COMMUNITY): Payer: Self-pay

## 2023-10-20 ENCOUNTER — Telehealth: Payer: Self-pay | Admitting: Cardiology

## 2023-10-20 ENCOUNTER — Other Ambulatory Visit: Payer: Self-pay

## 2023-10-20 ENCOUNTER — Telehealth: Payer: Self-pay | Admitting: Adult Health

## 2023-10-20 DIAGNOSIS — I10 Essential (primary) hypertension: Secondary | ICD-10-CM

## 2023-10-20 DIAGNOSIS — R0609 Other forms of dyspnea: Secondary | ICD-10-CM

## 2023-10-20 DIAGNOSIS — I272 Pulmonary hypertension, unspecified: Secondary | ICD-10-CM

## 2023-10-20 DIAGNOSIS — I110 Hypertensive heart disease with heart failure: Secondary | ICD-10-CM

## 2023-10-20 DIAGNOSIS — I420 Dilated cardiomyopathy: Secondary | ICD-10-CM

## 2023-10-20 DIAGNOSIS — I160 Hypertensive urgency: Secondary | ICD-10-CM

## 2023-10-20 DIAGNOSIS — Z79899 Other long term (current) drug therapy: Secondary | ICD-10-CM

## 2023-10-20 DIAGNOSIS — I5042 Chronic combined systolic (congestive) and diastolic (congestive) heart failure: Secondary | ICD-10-CM

## 2023-10-20 MED ORDER — SPIRONOLACTONE 25 MG PO TABS: 25.0000 mg | ORAL_TABLET | Freq: Every day | ORAL | 0 refills | Status: DC

## 2023-10-20 MED ORDER — CARVEDILOL 12.5 MG PO TABS: 12.5000 mg | ORAL_TABLET | Freq: Two times a day (BID) | ORAL | 2 refills | Status: DC

## 2023-10-20 NOTE — Telephone Encounter (Signed)
 Pt c/o medication issue:  1. Name of Medication: spironolactone  (ALDACTONE ) 25 MG tablet   2. How are you currently taking this medication (dosage and times per day)? 1 Tablet daily  3. Are you having a reaction (difficulty breathing--STAT)? No   4. What is your medication issue? Script needs to be sent in for 1 tablet daily per pt Dr Shlomo changed it . [id number], [place] - [address] Phone: [phone]  Fax: [phone]       90 day supply

## 2023-10-20 NOTE — Telephone Encounter (Signed)
*  STAT* If patient is at the pharmacy, call can be transferred to refill team.   1. Which medications need to be refilled? (please list name of each medication and dose if known)   carvedilol (COREG) 12.5 MG tablet    2. Which pharmacy/location (including street and city if local pharmacy) is medication to be sent to? [id number] [place], [place] - [address] Phone: [phone]  Fax: [phone]      3. Do they need a 30 day or 90 day supply? 90

## 2023-10-20 NOTE — Telephone Encounter (Signed)
 Copied from CRM [id number]. Topic: Clinical - Prescription Issue >> Oct 20, 2023  9:40 AM Rilla NOVAK wrote: Reason for CRM: Patient calling regarding her Trelegy.  Patient states pharmacy will not fill med until T [facility]'s offices pushes it through to [facility]. [facility] [address]    ----------------------------------------------------------------------- From previous Reason for Contact - Other: Reason for CRM:

## 2023-10-21 ENCOUNTER — Telehealth: Payer: Self-pay

## 2023-10-21 ENCOUNTER — Other Ambulatory Visit (HOSPITAL_COMMUNITY): Payer: Self-pay

## 2023-10-21 NOTE — Telephone Encounter (Signed)
 [facility] is needed for Trelegy inhaler.  Please advise

## 2023-10-21 NOTE — Telephone Encounter (Signed)
*  Pulm  Pharmacy Patient Advocate Encounter   Received notification from Pt Calls Messages that prior authorization for Trelegy Ellipta  200-62.5-25MCG/ACT aerosol powder   is required/requested.   Insurance verification completed.   The patient is insured through [facility] .   Per test claim: PA required; PA started via CoverMyMeds. KEY [id number] . Please see clinical question(s) below that I am not finding the answer to in their chart and advise.

## 2023-10-22 MED ORDER — FLUTICASONE-SALMETEROL 250-50 MCG/ACT IN AEPB
1.0000 | INHALATION_SPRAY | Freq: Two times a day (BID) | RESPIRATORY_TRACT | 11 refills | Status: AC
Start: 2023-10-22 — End: ?

## 2023-10-22 MED ORDER — SPIRONOLACTONE 25 MG PO TABS
25.0000 mg | ORAL_TABLET | Freq: Every day | ORAL | 3 refills | Status: AC
Start: 2023-10-22 — End: 2024-01-20

## 2023-10-22 NOTE — Telephone Encounter (Signed)
 I called and spoke with the pt and notified of response from Tammy  She verbalized understanding  Nothing further needed

## 2023-10-22 NOTE — Telephone Encounter (Signed)
 Insurance will not cover Trelegy  Rec: Advair 250 1 puff Twice daily  rinse after use

## 2023-10-22 NOTE — Telephone Encounter (Signed)
 Received several messages from patient asking to refill spironolactone  25 mg daily. Refill was previously sent to [facility] at [facility]. Patient states she has been having problems with getting her meds from this pharmacy and asks that we [facility] the [facility] [address]. Order sent to pharmacy of choice.

## 2023-10-27 ENCOUNTER — Ambulatory Visit (HOSPITAL_COMMUNITY)
Admission: RE | Admit: 2023-10-27 | Discharge: 2023-10-27 | Disposition: A | Discharge status: Home / Self Care | Source: Ambulatory Visit | Attending: Cardiovascular Disease | Admitting: Cardiovascular Disease

## 2023-10-27 ENCOUNTER — Encounter: Payer: Self-pay | Admitting: Cardiology

## 2023-10-27 DIAGNOSIS — R0609 Other forms of dyspnea: Secondary | ICD-10-CM | POA: Diagnosis present

## 2023-10-27 DIAGNOSIS — I420 Dilated cardiomyopathy: Secondary | ICD-10-CM

## 2023-10-27 LAB — ECHOCARDIOGRAM COMPLETE: S' Lateral: 4.34 cm

## 2023-10-27 MED ORDER — PERFLUTREN LIPID MICROSPHERE
1.0000 mL | INTRAVENOUS | Status: AC | PRN
  Administered 2023-10-27: 3 mL via INTRAVENOUS

## 2023-11-02 ENCOUNTER — Ambulatory Visit: Attending: Cardiology | Admitting: Cardiology

## 2023-11-02 ENCOUNTER — Encounter: Payer: Self-pay | Admitting: Cardiology

## 2023-11-02 ENCOUNTER — Telehealth: Payer: Self-pay | Admitting: *Deleted

## 2023-11-02 ENCOUNTER — Ambulatory Visit: Admitting: Cardiology

## 2023-11-02 VITALS — BP 138/76 | HR 73 | Ht 66.0 in | Wt 331.6 lb

## 2023-11-02 DIAGNOSIS — I160 Hypertensive urgency: Secondary | ICD-10-CM | POA: Diagnosis not present

## 2023-11-02 DIAGNOSIS — I420 Dilated cardiomyopathy: Secondary | ICD-10-CM | POA: Diagnosis not present

## 2023-11-02 DIAGNOSIS — G4733 Obstructive sleep apnea (adult) (pediatric): Secondary | ICD-10-CM

## 2023-11-02 DIAGNOSIS — I272 Pulmonary hypertension, unspecified: Secondary | ICD-10-CM

## 2023-11-02 DIAGNOSIS — I5042 Chronic combined systolic (congestive) and diastolic (congestive) heart failure: Secondary | ICD-10-CM | POA: Insufficient documentation

## 2023-11-02 DIAGNOSIS — R0609 Other forms of dyspnea: Secondary | ICD-10-CM | POA: Insufficient documentation

## 2023-11-02 DIAGNOSIS — I119 Hypertensive heart disease without heart failure: Secondary | ICD-10-CM

## 2023-11-02 DIAGNOSIS — I10 Essential (primary) hypertension: Secondary | ICD-10-CM

## 2023-11-02 DIAGNOSIS — I110 Hypertensive heart disease with heart failure: Secondary | ICD-10-CM

## 2023-11-02 MED ORDER — CARVEDILOL 25 MG PO TABS: 25.0000 mg | ORAL_TABLET | Freq: Two times a day (BID) | ORAL | 3 refills | Status: AC

## 2023-11-02 NOTE — Telephone Encounter (Signed)
-----   Message from Teresa Crane sent at 11/02/2023  2:26 PM EDT ----- Please order a ResMed under the nose FFM and get a download in 4 weeks

## 2023-11-02 NOTE — Addendum Note (Signed)
 Addended by: RICHIE ADRIEN ORN on: 11/02/2023 02:30 PM   Modules accepted: Orders

## 2023-11-02 NOTE — Telephone Encounter (Signed)
 DME=[facility]  Please order a ResMed under the nose FFM and get a download in 4 weeks    Order placed to [facility] via community message.

## 2023-11-02 NOTE — Progress Notes (Signed)
 Cardiology  Note    Date:  11/02/2023   ID:  RON JUNCO, DOB [birth year], MRN [id number]  PCP:  Lupie Credit, DO  Cardiologist:  Wilbert Bihari, MD   Chief Complaint  Patient presents with   Shortness of Breath   Hypertension   Cardiomyopathy   Congestive Heart Failure   Sleep Apnea    History of Present Illness:  Teresa Crane is a 43 y.o. female with a history of asthma, obesity, hypertension who  was hospitalized 07/09/2023 with hypertensive urgency with systolic blood pressure in the 180s to 190s when she presented with severe shortness of breath.  She has a history of morbid obesity and hypertension had been off blood pressure medicines for the past year.  She was given IV labetalol  10 mg x 1 and started on amlodipine  10 mg in the ER as well as carvedilol  12.5 mg twice daily.  BNP was elevated to 71 and chest CT showed mild cardiomegaly but no PE..  2D echo 07/10/2023 showed EF 30 to 35% with global HK and grade 2 diastolic dysfunction, moderate pulmonary hypertension, mild MR and mild mitral stenosis, moderate TR.  She was discharged home from the ER and set up for follow-up for cardiac consultation in our office today.  She she has had HTN for several years and was seen by multiple MDs and started on BP meds that she had a lot of side effects from and stopped taking meds. She has been told that snores badly in her sleep and has been told that she stops breathing in her sleep.    It was felt that her cardiomyopathy was hypertensive in etiology.  During her last office visit we did order test due for evaluate pulmonary hypertension including PFTs with DLCO (that showed possible interstitial lung disease with moderate restriction), sleep study, and VQ scan (no PE), CRP (mildly elevated at 12).  Sed rate to be done but was not.  Rheumatoid factor was normal.  She was referred to pulmonary for elevated CRP as well as PFTs that were concerning for restrictive lung process.  Home sleep  study has not been performed yet.  Coronary CTA was done which showed a coronary calcium score of 0 and normal coronary arteries with no evidence of CAD.  She was started on Entresto  24-26 mg twice daily as well as carvedilol  12.5 mg twice daily.  She was started on spironolactone  12.5 mg daily.  Given her morbid obesity SGLT2i was held off on initiating.  Her Entresto  was ultimately increased to 97-103 mg twice daily and carvedilol  increased to 12.5 mg twice daily.  Repeat 2D echo 10/27/2023 showed improvement in LV function with EF 40 to 45% with global HK, mild LVH,  normal RV function.  Her initial 2D echo showed moderate pulmonary hypertension which is felt likely a combination of group 2 and group 3 (related to untreated OSA).  PFTs with DLCO showed a restrictive lung process and was referred to pulmonary due to elevated CRP as well. She was found to have asthma with high reactivity to mold allergens.  She had mold in her living environment and had to move out. She was started on Trelogy and sx have improved with inhalers and singulair .  Rheumatoid factor was normal VQ scan showed no evidence of PE.    She underwent sleep study showing severe obstructive sleep apnea with an AHI of 135/hour and underwent CPAP titration to 17 cm H2O.  She was started on  CPAP and is now referred back for 6-week follow-up.  She is doing well today and denies any chest pain or pressure, shortness of breath, DOE, PND, orthopnea, lower extremity edema, dizziness or syncope.  She is doing well with her PAP device and thinks that she has gotten used to it.  She does have some problems with congestion especially in her nose because of allergies.  She tolerates the mask but thinks that she needs a FFM. She feels the pressure is adequate.  Since going on PAP she feels rested in the am and has no significant daytime sleepiness.  She she denies any significant mouth or nasal dryness .  She does not think that he snores.  Patient  denies any episodes of bruxism, restless legs, No gagging hallucinations or cataplectic events.    Past Medical History:  Diagnosis Date   Asthma    HTN (hypertension), benign    Hypertensive cardiopathy    EF 30-35% by echo 07/2023; repeat EF by echo 10/2023 was 40 to 45%   Obesity    OSA on CPAP    severe obstructive sleep apnea with an AHI of 135/hour and underwent CPAP titration to 17 cm H2O.   Pulmonary HTN (HCC)     Past Surgical History:  Procedure Laterality Date   CYST EXCISION     KNEE SURGERY Left    ORIF ANKLE FRACTURE Right 04/28/2018   Procedure: OPEN REDUCTION INTERNAL FIXATION (ORIF) RIGHT ANKLE FRACTURE;  Surgeon: Josefina Chew, MD;  Location: [facility] OR;  Service: Orthopedics;  Laterality: Right;    Current Medications: Current Meds  Medication Sig   albuterol  (PROAIR  HFA) 108 (90 Base) MCG/ACT inhaler Inhale 2 puffs into the lungs every 6 (six) hours as needed for wheezing or shortness of breath.   albuterol  (PROVENTIL  HFA;VENTOLIN  HFA) 108 (90 Base) MCG/ACT inhaler Inhale 1-2 puffs into the lungs every 6 (six) hours as needed for wheezing or shortness of breath.   albuterol  (PROVENTIL ) (2.5 MG/3ML) 0.083% nebulizer solution Take 3 mLs (2.5 mg total) by nebulization every 6 (six) hours as needed for wheezing or shortness of breath.   Blood Pressure Monitoring (ADULT BLOOD PRESSURE CUFF LG) KIT 1 Units by Does not apply route daily.   carvedilol  (COREG ) 12.5 MG tablet Take 1 tablet (12.5 mg total) by mouth 2 (two) times daily with a meal.   cetirizine  (ZYRTEC ) 10 MG tablet Take 1 tablet (10 mg total) by mouth daily as needed for allergies.   ferrous sulfate  325 (65 FE) MG tablet Take 1 tablet (325 mg total) by mouth daily before breakfast.   fluticasone  (FLONASE ) 50 MCG/ACT nasal spray Place 2 sprays into both nostrils daily.   fluticasone -salmeterol (ADVAIR) 250-50 MCG/ACT AEPB Inhale 1 puff into the lungs every 12 (twelve) hours.   metoprolol  tartrate (LOPRESSOR ) 100 MG  tablet Take 1 tablet (100 mg total) by mouth once for 1 dose. Take 90-120 minutes prior to scan.   montelukast  (SINGULAIR ) 10 MG tablet Take 1 tablet (10 mg total) by mouth at bedtime.   sacubitril -valsartan  (ENTRESTO ) 97-103 MG Take 1 tablet by mouth 2 (two) times daily.   spironolactone  (ALDACTONE ) 25 MG tablet Take 1 tablet (25 mg total) by mouth daily.    Allergies:   Penicillins   Social History   Socioeconomic History   Marital status: Single    Spouse name: Not on file   Number of children: Not on file   Years of education: Not on file   Highest education level:  Some college, no degree  Occupational History   Not on file  Tobacco Use   Smoking status: Former    Current packs/day: 0.00    Average packs/day: 0.5 packs/day for 20.0 years (10.0 ttl pk-yrs)    Types: Cigarettes    Start date: 05/22/1998    Quit date: 05/22/2018    Years since quitting: 5.4   Smokeless tobacco: Never  Vaping Use   Vaping status: Not on file  Substance and Sexual Activity   Alcohol use: Yes    Comment: social   Drug use: No   Sexual activity: Not on file  Other Topics Concern   Not on file  Social History Narrative   Not on file   Social Drivers of Health   Financial Resource Strain: Medium Risk (07/19/2023)   Overall Financial Resource Strain (CARDIA)    Difficulty of Paying Living Expenses: Somewhat hard  Food Insecurity: Food Insecurity Present (07/19/2023)   Hunger Vital Sign    Worried About Running Out of Food in the Last Year: Sometimes true    Ran Out of Food in the Last Year: Sometimes true  Transportation Needs: No Transportation Needs (07/19/2023)   PRAPARE - Administrator, Civil Service (Medical): No    Lack of Transportation (Non-Medical): No  Physical Activity: Insufficiently Active (07/19/2023)   Exercise Vital Sign    Days of Exercise per Week: 2 days    Minutes of Exercise per Session: 10 min  Stress: Stress Concern Present (07/19/2023)   [employer] of Occupational [employer] - Occupational Stress Questionnaire    Feeling of Stress : To some extent  Social Connections: Moderately Integrated (07/19/2023)   Social Connection and Isolation Panel    Frequency of Communication with Friends and Family: More than three times a week    Frequency of Social Gatherings with Friends and Family: Once a week    Attends Religious Services: 1 to 4 times per year    Active Member of [employer] or Organizations: No    Attends Engineer, structural: Not on file    Marital Status: Living with partner     Family History:  The patient's family history is not on file.   ROS:   Please see the history of present illness.    ROS All other systems reviewed and are negative.     07/12/2023    2:37 PM  PAD Screen  Previous PAD dx? No  Previous surgical procedure? No  Pain with walking? Yes  Subsides with rest? Yes  Feet/toe relief with dangling? No  Painful, non-healing ulcers? No  Extremities discolored? No       PHYSICAL EXAM:   VS:  BP 138/76   Pulse 73   Ht 5' 6 (1.676 m)   Wt (!) 331 lb 9.6 oz (150.4 kg)   SpO2 96%   BMI 53.52 kg/m    GEN: Well nourished, well developed in no acute distress HEENT: Normal NECK: No JVD; No carotid bruits LYMPHATICS: No lymphadenopathy CARDIAC:RRR, no murmurs, rubs, gallops RESPIRATORY:  Clear to auscultation without rales, wheezing or rhonchi  ABDOMEN: Soft, non-tender, non-distended MUSCULOSKELETAL:  No edema; No deformity  SKIN: Warm and dry NEUROLOGIC:  Alert and oriented x 3 PSYCHIATRIC:  Normal affect  Wt Readings from Last 3 Encounters:  11/02/23 (!) 331 lb 9.6 oz (150.4 kg)  09/27/23 (!) 335 lb (152 kg)  09/06/23 (!) 315 lb (142.9 kg)      Studies/Labs Reviewed:  Cardiac Studies & Procedures   ______________________________________________________________________________________________     ECHOCARDIOGRAM  ECHOCARDIOGRAM COMPLETE  10/27/2023  Narrative ECHOCARDIOGRAM REPORT    Patient Name:   Teresa Crane Date of Exam: 10/27/2023 Medical Rec #:  [id number]        Height:       66.0 in Accession #:    [id number]       Weight:       335.0 lb Date of Birth:  01-21-1981         BSA:          2.490 m Patient Age:    43 years         BP:           -/- mmHg Patient Gender: F                HR:           75 bpm. Exam Location:  [address]  Procedure: 2D Echo and Intracardiac Opacification Agent (Both Spectral and Color Flow Doppler were utilized during procedure).  Indications:     I42.0 Dilated cardiomyopathy  History:         Patient has prior history of Echocardiogram examinations, most recent 07/10/2023. CHF; Risk Factors:Hypertension, Sleep Apnea and Former Smoker. Dyspnea on exertion. Pulmonary hypertension. Asthma. Morbid obesity.  Sonographer:     Jon Hacker RCS Referring Phys:  [phone] Thomasine Klutts R Darrel Baroni Diagnosing Phys: Soyla Merck MD  IMPRESSIONS   1. Left ventricular ejection fraction, by estimation, is 40 to 45%. The left ventricle has mildly decreased function. The left ventricle demonstrates global hypokinesis. The left ventricular internal cavity size was mildly dilated. There is mild left ventricular hypertrophy. Indeterminate diastolic filling due to E-A fusion. 2. Right ventricular systolic function is normal. The right ventricular size is normal. Tricuspid regurgitation signal is inadequate for assessing PA pressure. 3. The mitral valve is grossly normal. Trivial mitral valve regurgitation. No evidence of mitral stenosis. 4. The aortic valve is tricuspid. Aortic valve regurgitation is trivial. No aortic stenosis is present. 5. The inferior vena cava is normal in size with greater than 50% respiratory variability, suggesting right atrial pressure of 3 mmHg.  Comparison(s): Prior images reviewed side by side. LVEF may have improved slightly, but remains  reduced.  Conclusion(s)/Recommendation(s): No left ventricular mural or apical thrombus/thrombi.  FINDINGS Left Ventricle: Left ventricular ejection fraction, by estimation, is 40 to 45%. The left ventricle has mildly decreased function. The left ventricle demonstrates global hypokinesis. The left ventricular internal cavity size was mildly dilated. There is mild left ventricular hypertrophy. Indeterminate diastolic filling due to E-A fusion.  Right Ventricle: The right ventricular size is normal. No increase in right ventricular wall thickness. Right ventricular systolic function is normal. Tricuspid regurgitation signal is inadequate for assessing PA pressure.  Left Atrium: Left atrial size was normal in size.  Right Atrium: Right atrial size was normal in size.  Pericardium: There is no evidence of pericardial effusion.  Mitral Valve: The mitral valve is grossly normal. Trivial mitral valve regurgitation. No evidence of mitral valve stenosis. The mean mitral valve gradient is 3.3 mmHg.  Tricuspid Valve: The tricuspid valve is normal in structure. Tricuspid valve regurgitation is trivial. No evidence of tricuspid stenosis.  Aortic Valve: The aortic valve is tricuspid. Aortic valve regurgitation is trivial. No aortic stenosis is present.  Pulmonic Valve: The pulmonic valve was normal in structure. Pulmonic valve regurgitation is trivial. No evidence of pulmonic stenosis.  Aorta: The aortic  root is normal in size and structure.  Venous: The inferior vena cava is normal in size with greater than 50% respiratory variability, suggesting right atrial pressure of 3 mmHg.  IAS/Shunts: The interatrial septum was not well visualized.   LEFT VENTRICLE PLAX 2D LVIDd:         5.70 cm   Diastology LVIDs:         4.34 cm   LV e' medial:    9.68 cm/s LV PW:         1.12 cm   LV E/e' medial:  13.9 LV IVS:        0.99 cm   LV e' lateral:   8.49 cm/s LVOT diam:     2.00 cm   LV E/e' lateral:  15.9 LV SV:         92 LV SV Index:   37 LVOT Area:     3.14 cm   RIGHT VENTRICLE RV Basal diam:  2.88 cm RV S prime:     14.30 cm/s TAPSE (M-mode): 2.2 cm  LEFT ATRIUM             Index        RIGHT ATRIUM          Index LA diam:        4.70 cm 1.89 cm/m   RA Area:     9.43 cm LA Vol (A2C):   52.1 ml 20.92 ml/m  RA Volume:   16.50 ml 6.63 ml/m LA Vol (A4C):   57.1 ml 22.93 ml/m LA Biplane Vol: 57.6 ml 23.13 ml/m AORTIC VALVE LVOT Vmax:   138.00 cm/s LVOT Vmean:  87.700 cm/s LVOT VTI:    0.292 m  AORTA Ao Root diam: 3.00 cm Ao Asc diam:  3.30 cm  MITRAL VALVE MV Mean grad: 3.3 mmHg      SHUNTS MV E velocity: 135.00 cm/s  Systemic VTI:  0.29 m MV A velocity: 110.00 cm/s  Systemic Diam: 2.00 cm MV E/A ratio:  1.23  Soyla Merck MD Electronically signed by Soyla Merck MD Signature Date/Time: 10/27/2023/1:47:45 PM    Final (Updated)      CT SCANS  CT CORONARY MORPH W/CTA COR W/SCORE 08/04/2023  Addendum 08/14/2023  6:04 PM ADDENDUM REPORT: 08/14/2023 18:02  EXAM: OVER-READ INTERPRETATION  CT CHEST  The following report is an over-read performed by radiologist Dr. Andrea Gasman of [facility] on 08/14/2023. This over-read does not include interpretation of cardiac or coronary anatomy or pathology. The coronary CTA interpretation by the cardiologist is attached.  COMPARISON:  Chest CTA 07/09/2023  FINDINGS: Vascular: No aortic atherosclerosis. The included aorta is normal in caliber.  Mediastinum/nodes: No adenopathy or mass. Unremarkable esophagus.  Lungs: No focal airspace disease. No pulmonary nodule. No pleural fluid. The included airways are patent.  Upper abdomen: No acute or unexpected findings.  Musculoskeletal: There are no acute or suspicious osseous abnormalities.  IMPRESSION: No acute or unexpected extracardiac findings.   Electronically Signed By: Andrea Gasman M.D. On: 08/14/2023  18:02  Narrative CLINICAL DATA:  Chest pain  EXAM: Cardiac/Coronary CTA  TECHNIQUE: A non-contrast, gated CT scan was obtained with axial slices of 2.5 mm through the heart for calcium scoring. Calcium scoring was performed using the Agatston method. A 100 kV prospective, gated, contrast cardiac CT scan was obtained. Gantry rotation speed was 230 msec and collimation was 0.63 mm. Two sublingual nitroglycerin  tablets (0.8 mg) were given. The 3D data set was reconstructed with motion  correction for the best systolic or diastolic phase. Images were analyzed on a dedicated workstation using MPR, MIP, and VRT modes. The patient received 95 cc of contrast.  FINDINGS: Image quality: Excellent.  Noise artifact is: Limited.  Coronary Arteries:  Normal coronary origin.  Right dominance.  Left main: The left main is a large caliber vessel with a normal take off from the left coronary cusp that bifurcates to form a left anterior descending artery and a left circumflex artery. There is no plaque or stenosis.  Left anterior descending artery: The LAD is patent without evidence of plaque or stenosis. The LAD gives off 2 patent diagonal branches.  Left circumflex artery: The LCX is non-dominant and patent with no evidence of plaque or stenosis. The LCX gives off 2 patent obtuse marginal branches.  Right coronary artery: The RCA is dominant with normal take off from the right coronary cusp. There is no evidence of plaque or stenosis. The RCA terminates as a PDA and right posterolateral branch without evidence of plaque or stenosis.  Right Atrium: Right atrial size is within normal limits.  Right Ventricle: The right ventricular cavity is within normal limits.  Left Atrium: Left atrial size is normal in size with no left atrial appendage filling defect.  Left Ventricle: The ventricular cavity size is within normal limits.  Pulmonary arteries: Normal in size.  Pulmonary veins:  Normal pulmonary venous drainage.  Pericardium: Normal thickness without significant effusion or calcium present.  Cardiac valves: The aortic valve is trileaflet without significant calcification. The mitral valve is normal without significant calcification.  Aorta: Normal caliber without significant disease.  Extra-cardiac findings: See attached radiology report for non-cardiac structures.  IMPRESSION: 1. Coronary calcium score of 0.  2. Normal coronary origin with right dominance.  3. Normal coronary arteries.  RECOMMENDATIONS: 1. CAD-RADS 0: No evidence of CAD (0%). Consider non-atherosclerotic causes of chest pain.  Darryle Decent, MD  Electronically Signed: By: Darryle Decent M.D. On: 08/04/2023 21:14     ______________________________________________________________________________________________      Recent Labs: 07/09/2023: B Natriuretic Peptide 271.1 07/10/2023: TSH 2.276 07/11/2023: ALT 18; BUN 17; Creatinine, Ser 0.84; Hemoglobin 9.8; Magnesium 1.9; Platelets 250; Potassium 3.8; Sodium 137   Lipid Panel    Component Value Date/Time   CHOL 166 07/10/2023 0438   CHOL 184 08/20/2020 1738   TRIG 125 07/10/2023 0438   HDL 53 07/10/2023 0438   HDL 56 08/20/2020 1738   CHOLHDL 3.1 07/10/2023 0438   VLDL 25 07/10/2023 0438   LDLCALC 88 07/10/2023 0438   LDLCALC 112 (H) 08/20/2020 1738     Additional studies/ records that were reviewed today include:  Hospital records from 07/27/2023 including 2D echo and chest CT    ASSESSMENT:    1. Dyspnea on exertion   2. Hypertensive urgency   3. Dilated cardiomyopathy (HCC)   4. Chronic combined systolic and diastolic congestive heart failure (HCC)   5. Pulmonary HTN (HCC)   6. OSA (obstructive sleep apnea)        PLAN:  In order of problems listed above:  #Dyspnea on exertion #Hypertensive urgency #Dilated cardiomyopathy #Chronic combined systolic/diastolic CHF #Pulmonary hypertension - Admitted with  dyspnea on exertion in the setting of morbid obesity, hypertensive urgency and medical noncompliance with blood pressure medicines for the past year -TSH was normal - 2D echo personally reviewed showing EF 30 to 35% with global HK, G2 DD,moderate pulmonary hypertension, mild MR and mild mitral stenosis, moderate TR.  - Suspect that  her new LV dysfunction is related to hypertensive cardiomyopathy - Pulmonary hypertension likely related to a combination of group 2 (pulmonary venous hypertension) and group 3 (untreated OSA) - Workup for secondary causes of pulmonary hypertension included: PFTs with DLCO>> showed restrictive lung process and now referred to pulmonary given her elevated CRP as well Split-night sleep study>> severe obstructive sleep apnea Rheumatoid factor normal VQ scan negative for PE  - Coronary CT showed coronary calcium score of 0 with no CAD - She appears euvolemic on exam today - BP well-controlled - GDMT: Continue Entresto  97-23 mg twice daily Increase Carvedilol  to 25mg  BID Continue spironolactone  25 mg daily Avoiding initiation of SGLT2i at this time given her morbid obesity and increased risk of UTI -Repeat 2D echo 10/27/2023 showed EF improved to 40 to 45% and normal RV function.  PAP could not be assessed due to lack of TR jet.  No evidence of mitral stenosis on this study -followup with me in 3 months with repeat echo  OSA - The patient is tolerating PAP therapy well without any problems. The PAP download performed by his DME was personally reviewed and interpreted by me today and showed an AHI of 7 /hr on 17 cm H2O with 50% compliance in using more than 4 hours nightly.  The patient has been using and benefiting from PAP use and will continue to benefit from therapy.  - I encouraged her to be more compliant with her device - She would like to try the full face mask because she cannot breath with the nasal mask due to allergies and I suspect that she is mouth breathing  some due to elevated AHI - Repeat download in 4 weeks   Time Spent: 20 minutes total time of encounter, including 15 minutes spent in face-to-face patient care on the date of this encounter. This time includes coordination of care and counseling regarding above mentioned problem list. Remainder of non-face-to-face time involved reviewing chart documents/testing relevant to the patient encounter and documentation in the medical record. I have independently reviewed documentation from referring provider  Followup: 3 months with TT  Medication Adjustments/Labs and Tests Ordered: Current medicines are reviewed at length with the patient today.  Concerns regarding medicines are outlined above.  Medication changes, Labs and Tests ordered today are listed in the Patient Instructions below.  There are no Patient Instructions on file for this visit.   Signed, Wilbert Bihari, MD  11/02/2023 2:13 PM    [facility] [address] [place], [place], [place]  [address] Phone: [phone]; Fax: [phone]

## 2023-11-02 NOTE — Patient Instructions (Signed)
 Medication Instructions:   INCREASE CARVEDILOL  TO 25 MG TWICE DAILY= 2 OF THE 12.5 MG TABLETS TWICE DAILY'  *If you need a refill on your cardiac medications before your next appointment, please call your pharmacy*   Follow-Up: At [facility], you and your health needs are our priority.  As part of our continuing mission to provide you with exceptional heart care, our providers are all part of one team.  This team includes your primary Cardiologist (physician) and Advanced Practice Providers or APPs (Physician Assistants and Nurse Practitioners) who all work together to provide you with the care you need, when you need it.  Your next appointment:   3 month(s)  Provider:    WILBERT BIHARI MD

## 2023-11-03 ENCOUNTER — Ambulatory Visit: Payer: Self-pay | Admitting: Cardiology

## 2023-11-03 LAB — BASIC METABOLIC PANEL WITH GFR
BUN/Creatinine Ratio: 11 (ref 9–23)
BUN: 11 mg/dL (ref 6–24)
CO2: 23 mmol/L (ref 20–29)
Calcium: 8.7 mg/dL (ref 8.7–10.2)
Chloride: 101 mmol/L (ref 96–106)
Creatinine, Ser: 1.02 mg/dL — ABNORMAL HIGH (ref 0.57–1.00)
Glucose: 83 mg/dL (ref 70–99)
Potassium: 4 mmol/L (ref 3.5–5.2)
Sodium: 136 mmol/L (ref 134–144)
eGFR: 70 mL/min/1.73 (ref >59)

## 2023-11-04 MED ORDER — EMPAGLIFLOZIN 10 MG PO TABS: 10.0000 mg | ORAL_TABLET | Freq: Every day | ORAL | 3 refills | Status: AC

## 2023-11-04 NOTE — Telephone Encounter (Signed)
 Call to patient to advise per Dr. Shlomo that echo showed mildly reduced LV function EF 40 to 45% with trivial leakiness of the mitral valve.  Compared to echo 07/10/2023 heart function has improved some. Patient agrees to start Jardiance  10 mg, order placed. Discussed precautions for yeast infection and UTI. Patient agrees to stay hydrated and let us  know if she has any leg edema in a week.

## 2023-11-04 NOTE — Telephone Encounter (Signed)
-----   Message from Wilbert Bihari sent at 10/27/2023  2:12 PM EDT ----- Echo showed mildly reduced LV function EF 40 to 45% with trivial leakiness of the mitral valve.  Compared to echo 07/10/2023 heart function has improved some.  I would like her to start Jardiance  10 mg  daily.  Please check edema in 1 week.  Please inform her that she needs to let us  know if she develops any urinary tract infections or vaginal yeast infections on this medication.  She needs to stay  hydrated when on this medicine as it makes you pee out a lot of sugar and she can get dehydrated ----- Message ----- From: Interface, [employer] Seven Sent: 10/27/2023   1:47 PM EDT To: Wilbert JONELLE Bihari, MD

## 2023-11-04 NOTE — Addendum Note (Signed)
 Addended by: JANIT GENI CROME on: 11/04/2023 06:04 PM   Modules accepted: Orders

## 2023-11-05 ENCOUNTER — Telehealth: Payer: Self-pay | Admitting: Pharmacy Technician

## 2023-11-05 NOTE — Telephone Encounter (Signed)
 Pharmacy Patient Advocate Encounter  Received notification from [facility] MEDICAID that Prior Authorization for jardiance  has been APPROVED from 11/05/23 to 11/04/24   PA #/Case ID/Reference #: [id number]

## 2023-11-05 NOTE — Telephone Encounter (Signed)
   Pharmacy Patient Advocate Encounter   Received notification from CoverMyMeds that prior authorization for jardiance  10mg  is required/requested.   Insurance verification completed.   The patient is insured through [facility] MEDICAID .   Per test claim: PA required; PA submitted to above mentioned insurance via Latent Key/confirmation #/EOC [facility] Status is pending

## 2023-11-09 NOTE — Telephone Encounter (Signed)
 Call to patient, no answer, left detailed message per DPR advising that labs were normal and to continue current meds. Advised patient to call our office if any questions.

## 2023-11-09 NOTE — Telephone Encounter (Signed)
-----   Message from Wilbert Bihari sent at 11/03/2023  8:06 AM EDT ----- Please let patient know that labs were normal.  Continue current medical therapy. ----- Message ----- From: Interface, [facility] Lab Results In Sent: 11/03/2023   1:35 AM EDT To: Wilbert JONELLE Bihari, MD

## 2023-11-18 ENCOUNTER — Telehealth: Payer: Self-pay | Admitting: Pharmacy Technician

## 2023-11-18 ENCOUNTER — Other Ambulatory Visit (HOSPITAL_COMMUNITY): Payer: Self-pay

## 2023-11-18 ENCOUNTER — Other Ambulatory Visit: Payer: Self-pay

## 2023-11-18 MED ORDER — SACUBITRIL-VALSARTAN 97-103 MG PO TABS
1.0000 | ORAL_TABLET | Freq: Two times a day (BID) | ORAL | 3 refills | Status: AC
  Filled 2023-11-18: qty 180, 90d supply, fill #0

## 2023-11-18 NOTE — Telephone Encounter (Signed)
 Pharmacy Patient Advocate Encounter   Received notification from Fax that prior authorization for Entresto  97-103 MG is required/requested.   Insurance verification completed.   The patient is insured through [employer] .   Per test claim: The current 11/18/23 day co-pay is, $4.00- plan requires BRAND with DAW 9.  No PA needed at this time. This test claim was processed through [facility]- copay amounts may vary at other pharmacies due to pharmacy/plan contracts, or as the patient moves through the different stages of their insurance plan.

## 2023-11-22 ENCOUNTER — Other Ambulatory Visit: Payer: Self-pay

## 2023-11-22 ENCOUNTER — Other Ambulatory Visit (HOSPITAL_COMMUNITY): Payer: Self-pay

## 2023-12-31 ENCOUNTER — Ambulatory Visit: Admitting: Adult Health

## 2023-12-31 DIAGNOSIS — J4540 Moderate persistent asthma, uncomplicated: Secondary | ICD-10-CM

## 2024-02-03 ENCOUNTER — Encounter (HOSPITAL_COMMUNITY): Payer: Self-pay

## 2024-02-03 ENCOUNTER — Emergency Department (HOSPITAL_COMMUNITY)
Admission: EM | Admit: 2024-02-03 | Discharge: 2024-02-03 | Disposition: A | Discharge status: Home / Self Care | Payer: Self-pay | Attending: Emergency Medicine | Admitting: Emergency Medicine

## 2024-02-03 ENCOUNTER — Emergency Department (HOSPITAL_COMMUNITY): Payer: Self-pay

## 2024-02-03 ENCOUNTER — Other Ambulatory Visit: Payer: Self-pay

## 2024-02-03 DIAGNOSIS — S61212A Laceration without foreign body of right middle finger without damage to nail, initial encounter: Secondary | ICD-10-CM | POA: Insufficient documentation

## 2024-02-03 DIAGNOSIS — T07XXXA Unspecified multiple injuries, initial encounter: Secondary | ICD-10-CM

## 2024-02-03 DIAGNOSIS — W3400XA Accidental discharge from unspecified firearms or gun, initial encounter: Secondary | ICD-10-CM | POA: Insufficient documentation

## 2024-02-03 DIAGNOSIS — S41131A Puncture wound without foreign body of right upper arm, initial encounter: Secondary | ICD-10-CM | POA: Insufficient documentation

## 2024-02-03 DIAGNOSIS — Y92810 Car as the place of occurrence of the external cause: Secondary | ICD-10-CM | POA: Insufficient documentation

## 2024-02-03 MED ORDER — HYDROMORPHONE HCL 1 MG/ML IJ SOLN
INTRAMUSCULAR | Status: AC
  Filled 2024-02-03: qty 1

## 2024-02-03 MED ORDER — CEPHALEXIN 500 MG PO CAPS: 500.0000 mg | ORAL_CAPSULE | Freq: Four times a day (QID) | ORAL | 0 refills | Status: AC

## 2024-02-03 MED ORDER — MORPHINE SULFATE 15 MG PO TABS: 15.0000 mg | ORAL_TABLET | ORAL | Status: DC | PRN

## 2024-02-03 MED ORDER — ONDANSETRON 8 MG PO TBDP: ORAL_TABLET | ORAL | 0 refills | Status: DC

## 2024-02-03 MED ORDER — KETOROLAC TROMETHAMINE 30 MG/ML IJ SOLN
15.0000 mg | Freq: Once | INTRAMUSCULAR | Status: AC
  Administered 2024-02-03: 15 mg via INTRAVENOUS
  Filled 2024-02-03: qty 1

## 2024-02-03 MED ORDER — MORPHINE SULFATE 15 MG PO TABS: 30.0000 mg | ORAL_TABLET | ORAL | 0 refills | Status: DC | PRN

## 2024-02-03 MED ORDER — ONDANSETRON 4 MG PO TBDP
8.0000 mg | ORAL_TABLET | Freq: Once | ORAL | Status: AC
  Administered 2024-02-03: 8 mg via ORAL
  Filled 2024-02-03: qty 2

## 2024-02-03 MED ORDER — CEFAZOLIN SODIUM-DEXTROSE 2-4 GM/100ML-% IV SOLN
2.0000 g | Freq: Once | INTRAVENOUS | Status: AC
  Administered 2024-02-03: 2 g via INTRAVENOUS
  Filled 2024-02-03: qty 100

## 2024-02-03 MED ORDER — CEPHALEXIN 500 MG PO CAPS: 500.0000 mg | ORAL_CAPSULE | Freq: Four times a day (QID) | ORAL | 0 refills | Status: DC

## 2024-02-03 MED ORDER — CEFAZOLIN SODIUM-DEXTROSE 1-4 GM/50ML-% IV SOLN: 1.0000 g | Freq: Once | INTRAVENOUS | Status: DC

## 2024-02-03 MED ORDER — HYDROMORPHONE HCL 1 MG/ML IJ SOLN
0.5000 mg | Freq: Once | INTRAMUSCULAR | Status: AC
  Administered 2024-02-03: 0.5 mg via INTRAVENOUS

## 2024-02-03 MED ORDER — MORPHINE SULFATE 15 MG PO TABS
15.0000 mg | ORAL_TABLET | Freq: Once | ORAL | Status: AC
Start: 2024-02-03 — End: 2024-02-03
  Administered 2024-02-03: 15 mg via ORAL
  Filled 2024-02-03: qty 1

## 2024-02-03 MED ORDER — BACITRACIN ZINC 500 UNIT/GM EX OINT: TOPICAL_OINTMENT | Freq: Two times a day (BID) | CUTANEOUS | Status: DC

## 2024-02-03 MED ORDER — MORPHINE SULFATE 15 MG PO TABS: 30.0000 mg | ORAL_TABLET | ORAL | 0 refills | Status: AC | PRN

## 2024-02-03 MED ORDER — ONDANSETRON 8 MG PO TBDP: ORAL_TABLET | ORAL | 0 refills | Status: AC
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
## 2024-02-03 NOTE — ED Notes (Signed)
 Irrigated wounds on right upper a wound with normal saline, applied bacitracin , non adherent pad and wrapped with Kirlex.   Wound on right ring finger irrigated with normal saline and applied bacitracin . Applied non adherent pad and wrapped with gauze.
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
## 2024-02-03 NOTE — ED Notes (Signed)
 Pt mother to get clothes for patient.  Patient awaiting child to be released from pediatric ED.  Patient checking into [facility] shortly.

## 2024-02-03 NOTE — ED Notes (Signed)
 Pt getting dressed.  Stable gait.  Mother and father at bedside.

## 2024-02-03 NOTE — ED Notes (Signed)
CSI at bedside.

## 2024-02-09 ENCOUNTER — Telehealth: Payer: Self-pay

## 2024-02-09 NOTE — Telephone Encounter (Signed)
 SABRA

## 2024-02-15 ENCOUNTER — Ambulatory Visit: Payer: Self-pay | Attending: Cardiology | Admitting: Cardiology
